# Patient Record
Sex: Male | Born: 1980 | Race: White | Hispanic: No | Marital: Single | Smoking: Current every day smoker
Health system: Southern US, Community
[De-identification: ages and names within clinical notes are randomized; demographics above are authoritative.]

## PROBLEM LIST (undated history)

## (undated) DIAGNOSIS — F329 Major depressive disorder, single episode, unspecified: Secondary | ICD-10-CM

## (undated) DIAGNOSIS — F32A Depression, unspecified: Secondary | ICD-10-CM

## (undated) DIAGNOSIS — M419 Scoliosis, unspecified: Secondary | ICD-10-CM

## (undated) HISTORY — PX: HERNIA REPAIR: SHX51

---

## 2007-11-20 ENCOUNTER — Emergency Department: Payer: Self-pay | Admitting: Emergency Medicine

## 2007-12-19 ENCOUNTER — Emergency Department: Payer: Self-pay | Admitting: Unknown Physician Specialty

## 2007-12-23 ENCOUNTER — Inpatient Hospital Stay: Payer: Self-pay | Admitting: Unknown Physician Specialty

## 2008-01-09 ENCOUNTER — Emergency Department: Payer: Self-pay | Admitting: Unknown Physician Specialty

## 2009-07-07 ENCOUNTER — Emergency Department: Payer: Self-pay | Admitting: Emergency Medicine

## 2010-09-28 ENCOUNTER — Emergency Department (HOSPITAL_COMMUNITY)
Admission: EM | Admit: 2010-09-28 | Discharge: 2010-09-28 | Payer: Self-pay | Source: Home / Self Care | Admitting: Emergency Medicine

## 2011-01-02 ENCOUNTER — Emergency Department (HOSPITAL_COMMUNITY)
Admission: EM | Admit: 2011-01-02 | Discharge: 2011-01-02 | Disposition: A | Discharge status: Home / Self Care | Payer: Self-pay | Attending: Emergency Medicine | Admitting: Emergency Medicine

## 2011-01-02 ENCOUNTER — Emergency Department (HOSPITAL_COMMUNITY): Payer: Self-pay

## 2011-01-02 DIAGNOSIS — M79609 Pain in unspecified limb: Secondary | ICD-10-CM | POA: Insufficient documentation

## 2011-01-02 DIAGNOSIS — Y929 Unspecified place or not applicable: Secondary | ICD-10-CM | POA: Insufficient documentation

## 2011-01-02 DIAGNOSIS — R51 Headache: Secondary | ICD-10-CM | POA: Insufficient documentation

## 2011-01-02 DIAGNOSIS — R042 Hemoptysis: Secondary | ICD-10-CM | POA: Insufficient documentation

## 2011-01-02 DIAGNOSIS — M2560 Stiffness of unspecified joint, not elsewhere classified: Secondary | ICD-10-CM | POA: Insufficient documentation

## 2011-01-02 DIAGNOSIS — S0990XA Unspecified injury of head, initial encounter: Secondary | ICD-10-CM | POA: Insufficient documentation

## 2011-01-02 DIAGNOSIS — S0003XA Contusion of scalp, initial encounter: Secondary | ICD-10-CM | POA: Insufficient documentation

## 2011-01-02 DIAGNOSIS — R071 Chest pain on breathing: Secondary | ICD-10-CM | POA: Insufficient documentation

## 2011-01-06 ENCOUNTER — Emergency Department (HOSPITAL_COMMUNITY)
Admission: EM | Admit: 2011-01-06 | Discharge: 2011-01-06 | Disposition: A | Discharge status: Home / Self Care | Payer: Self-pay | Attending: Emergency Medicine | Admitting: Emergency Medicine

## 2011-01-06 DIAGNOSIS — G44309 Post-traumatic headache, unspecified, not intractable: Secondary | ICD-10-CM | POA: Insufficient documentation

## 2011-01-06 DIAGNOSIS — R404 Transient alteration of awareness: Secondary | ICD-10-CM | POA: Insufficient documentation

## 2011-01-06 DIAGNOSIS — IMO0001 Reserved for inherently not codable concepts without codable children: Secondary | ICD-10-CM | POA: Insufficient documentation

## 2011-01-06 DIAGNOSIS — F0781 Postconcussional syndrome: Secondary | ICD-10-CM | POA: Insufficient documentation

## 2011-01-06 DIAGNOSIS — R112 Nausea with vomiting, unspecified: Secondary | ICD-10-CM | POA: Insufficient documentation

## 2011-01-06 DIAGNOSIS — Y92009 Unspecified place in unspecified non-institutional (private) residence as the place of occurrence of the external cause: Secondary | ICD-10-CM | POA: Insufficient documentation

## 2011-01-06 DIAGNOSIS — IMO0002 Reserved for concepts with insufficient information to code with codable children: Secondary | ICD-10-CM | POA: Insufficient documentation

## 2011-01-31 ENCOUNTER — Emergency Department (HOSPITAL_COMMUNITY)
Admission: EM | Admit: 2011-01-31 | Discharge: 2011-01-31 | Disposition: A | Discharge status: Home / Self Care | Payer: Self-pay | Attending: Emergency Medicine | Admitting: Emergency Medicine

## 2011-01-31 DIAGNOSIS — R45851 Suicidal ideations: Secondary | ICD-10-CM | POA: Insufficient documentation

## 2011-01-31 DIAGNOSIS — F1010 Alcohol abuse, uncomplicated: Secondary | ICD-10-CM | POA: Insufficient documentation

## 2011-01-31 DIAGNOSIS — F3289 Other specified depressive episodes: Secondary | ICD-10-CM | POA: Insufficient documentation

## 2011-01-31 DIAGNOSIS — F1410 Cocaine abuse, uncomplicated: Secondary | ICD-10-CM | POA: Insufficient documentation

## 2011-01-31 DIAGNOSIS — F17200 Nicotine dependence, unspecified, uncomplicated: Secondary | ICD-10-CM | POA: Insufficient documentation

## 2011-01-31 DIAGNOSIS — F329 Major depressive disorder, single episode, unspecified: Secondary | ICD-10-CM | POA: Insufficient documentation

## 2011-01-31 LAB — CBC
MCH: 36.4 pg — ABNORMAL HIGH (ref 26.0–34.0)
MCHC: 35.7 g/dL (ref 30.0–36.0)
RBC: 4.28 MIL/uL (ref 4.22–5.81)
WBC: 5.5 10*3/uL (ref 4.0–10.5)

## 2011-01-31 LAB — BASIC METABOLIC PANEL
Chloride: 108 mEq/L (ref 96–112)
GFR calc non Af Amer: >60 mL/min (ref >60)
Glucose, Bld: 103 mg/dL — ABNORMAL HIGH (ref 70–99)
Potassium: 3.7 mEq/L (ref 3.5–5.1)

## 2011-01-31 LAB — DIFFERENTIAL
Eosinophils Absolute: 0.2 10*3/uL (ref 0.0–0.7)
Eosinophils Relative: 3 % (ref 0–5)
Lymphocytes Relative: 37 % (ref 12–46)
Lymphs Abs: 2.1 10*3/uL (ref 0.7–4.0)
Monocytes Relative: 9 % (ref 3–12)

## 2011-01-31 LAB — RAPID URINE DRUG SCREEN, HOSP PERFORMED
Amphetamines: NOT DETECTED
Opiates: NOT DETECTED
Tetrahydrocannabinol: NOT DETECTED

## 2011-01-31 LAB — ETHANOL: Alcohol, Ethyl (B): 300 mg/dL — ABNORMAL HIGH (ref 0–10)

## 2011-08-19 ENCOUNTER — Emergency Department (HOSPITAL_COMMUNITY)
Admission: EM | Admit: 2011-08-19 | Discharge: 2011-08-19 | Disposition: A | Discharge status: Home / Self Care | Payer: Self-pay | Attending: Emergency Medicine | Admitting: Emergency Medicine

## 2011-08-19 ENCOUNTER — Emergency Department (HOSPITAL_COMMUNITY): Payer: Self-pay

## 2011-08-19 ENCOUNTER — Encounter: Payer: Self-pay | Admitting: Emergency Medicine

## 2011-08-19 DIAGNOSIS — H05239 Hemorrhage of unspecified orbit: Secondary | ICD-10-CM

## 2011-08-19 DIAGNOSIS — R51 Headache: Secondary | ICD-10-CM | POA: Insufficient documentation

## 2011-08-19 DIAGNOSIS — S022XXA Fracture of nasal bones, initial encounter for closed fracture: Secondary | ICD-10-CM | POA: Insufficient documentation

## 2011-08-19 DIAGNOSIS — M542 Cervicalgia: Secondary | ICD-10-CM | POA: Insufficient documentation

## 2011-08-19 DIAGNOSIS — S0510XA Contusion of eyeball and orbital tissues, unspecified eye, initial encounter: Secondary | ICD-10-CM | POA: Insufficient documentation

## 2011-08-19 DIAGNOSIS — T7411XA Adult physical abuse, confirmed, initial encounter: Secondary | ICD-10-CM | POA: Insufficient documentation

## 2011-08-19 HISTORY — DX: Scoliosis, unspecified: M41.9

## 2011-08-19 MED ORDER — HYDROCODONE-ACETAMINOPHEN 5-325 MG PO TABS: 1.0000 | ORAL_TABLET | Freq: Four times a day (QID) | ORAL | Status: AC | PRN
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
## 2013-05-28 ENCOUNTER — Emergency Department: Payer: Self-pay | Admitting: Emergency Medicine

## 2016-05-22 ENCOUNTER — Emergency Department
Admission: EM | Admit: 2016-05-22 | Discharge: 2016-05-23 | Disposition: A | Discharge status: Home / Self Care | Payer: Self-pay | Attending: Emergency Medicine | Admitting: Emergency Medicine

## 2016-05-22 ENCOUNTER — Encounter: Payer: Self-pay | Admitting: Emergency Medicine

## 2016-05-22 DIAGNOSIS — T1491XA Suicide attempt, initial encounter: Secondary | ICD-10-CM

## 2016-05-22 DIAGNOSIS — F1420 Cocaine dependence, uncomplicated: Secondary | ICD-10-CM

## 2016-05-22 DIAGNOSIS — F17210 Nicotine dependence, cigarettes, uncomplicated: Secondary | ICD-10-CM | POA: Insufficient documentation

## 2016-05-22 DIAGNOSIS — F332 Major depressive disorder, recurrent severe without psychotic features: Secondary | ICD-10-CM

## 2016-05-22 DIAGNOSIS — R4589 Other symptoms and signs involving emotional state: Secondary | ICD-10-CM

## 2016-05-22 DIAGNOSIS — F1020 Alcohol dependence, uncomplicated: Secondary | ICD-10-CM

## 2016-05-22 DIAGNOSIS — Y999 Unspecified external cause status: Secondary | ICD-10-CM | POA: Insufficient documentation

## 2016-05-22 DIAGNOSIS — IMO0002 Reserved for concepts with insufficient information to code with codable children: Secondary | ICD-10-CM

## 2016-05-22 DIAGNOSIS — Y939 Activity, unspecified: Secondary | ICD-10-CM | POA: Insufficient documentation

## 2016-05-22 DIAGNOSIS — T1491 Suicide attempt: Secondary | ICD-10-CM | POA: Insufficient documentation

## 2016-05-22 DIAGNOSIS — R4689 Other symptoms and signs involving appearance and behavior: Secondary | ICD-10-CM

## 2016-05-22 DIAGNOSIS — S51812A Laceration without foreign body of left forearm, initial encounter: Secondary | ICD-10-CM | POA: Insufficient documentation

## 2016-05-22 DIAGNOSIS — X788XXA Intentional self-harm by other sharp object, initial encounter: Secondary | ICD-10-CM | POA: Insufficient documentation

## 2016-05-22 DIAGNOSIS — F17200 Nicotine dependence, unspecified, uncomplicated: Secondary | ICD-10-CM

## 2016-05-22 DIAGNOSIS — Y929 Unspecified place or not applicable: Secondary | ICD-10-CM | POA: Insufficient documentation

## 2016-05-22 DIAGNOSIS — Z7289 Other problems related to lifestyle: Secondary | ICD-10-CM

## 2016-05-22 LAB — URINE DRUG SCREEN, QUALITATIVE (ARMC ONLY)
AMPHETAMINES, UR SCREEN: NOT DETECTED
BARBITURATES, UR SCREEN: NOT DETECTED
BENZODIAZEPINE, UR SCRN: NOT DETECTED
Cannabinoid 50 Ng, Ur ~~LOC~~: NOT DETECTED
Cocaine Metabolite,Ur ~~LOC~~: POSITIVE — AB
MDMA (Ecstasy)Ur Screen: NOT DETECTED
Methadone Scn, Ur: NOT DETECTED
OPIATE, UR SCREEN: NOT DETECTED
PHENCYCLIDINE (PCP) UR S: NOT DETECTED
Tricyclic, Ur Screen: NOT DETECTED

## 2016-05-22 LAB — CBC
HEMATOCRIT: 43.1 % (ref 40.0–52.0)
Hemoglobin: 15.2 g/dL (ref 13.0–18.0)
MCH: 36.1 pg — ABNORMAL HIGH (ref 26.0–34.0)
MCHC: 35.1 g/dL (ref 32.0–36.0)
MCV: 102.9 fL — AB (ref 80.0–100.0)
Platelets: 216 10*3/uL (ref 150–440)
RBC: 4.19 MIL/uL — ABNORMAL LOW (ref 4.40–5.90)
RDW: 14.4 % (ref 11.5–14.5)
WBC: 5.1 10*3/uL (ref 3.8–10.6)

## 2016-05-22 LAB — COMPREHENSIVE METABOLIC PANEL
ALBUMIN: 4.1 g/dL (ref 3.5–5.0)
ALK PHOS: 60 U/L (ref 38–126)
ALT: 32 U/L (ref 17–63)
AST: 51 U/L — AB (ref 15–41)
Anion gap: 9 (ref 5–15)
BILIRUBIN TOTAL: 0.7 mg/dL (ref 0.3–1.2)
BUN: 8 mg/dL (ref 6–20)
CALCIUM: 8.5 mg/dL — AB (ref 8.9–10.3)
CO2: 27 mmol/L (ref 22–32)
CREATININE: 0.81 mg/dL (ref 0.61–1.24)
Chloride: 105 mmol/L (ref 101–111)
GFR calc Af Amer: >60 mL/min (ref >60)
GFR calc non Af Amer: >60 mL/min (ref >60)
GLUCOSE: 98 mg/dL (ref 65–99)
Potassium: 4.1 mmol/L (ref 3.5–5.1)
Sodium: 141 mmol/L (ref 135–145)
TOTAL PROTEIN: 7 g/dL (ref 6.5–8.1)

## 2016-05-22 LAB — ACETAMINOPHEN LEVEL: Acetaminophen (Tylenol), Serum: <10 ug/mL — ABNORMAL LOW (ref 10–30)

## 2016-05-22 LAB — ETHANOL: Alcohol, Ethyl (B): 257 mg/dL — ABNORMAL HIGH (ref <5)

## 2016-05-22 LAB — SALICYLATE LEVEL: Salicylate Lvl: <4 mg/dL (ref 2.8–30.0)

## 2016-05-22 MED ORDER — LIDOCAINE HCL (PF) 1 % IJ SOLN
INTRAMUSCULAR | Status: AC
  Administered 2016-05-22: 10 mL via INTRADERMAL
  Filled 2016-05-22: qty 5

## 2016-05-22 MED ORDER — NICOTINE 21 MG/24HR TD PT24
21.0000 mg | MEDICATED_PATCH | Freq: Once | TRANSDERMAL | Status: DC
  Administered 2016-05-22: 21 mg via TRANSDERMAL
  Filled 2016-05-22: qty 1

## 2016-05-22 MED ORDER — BACITRACIN ZINC 500 UNIT/GM EX OINT
TOPICAL_OINTMENT | Freq: Two times a day (BID) | CUTANEOUS | Status: DC
  Administered 2016-05-22: via TOPICAL
  Administered 2016-05-23: 1 via TOPICAL
  Filled 2016-05-22 (×2): qty 0.9

## 2016-05-22 MED ORDER — LIDOCAINE HCL (PF) 1 % IJ SOLN
INTRAMUSCULAR | Status: AC
  Administered 2016-05-22: 23:00:00
  Filled 2016-05-22: qty 5

## 2016-05-22 MED ORDER — LIDOCAINE HCL (PF) 1 % IJ SOLN
10.0000 mL | Freq: Once | INTRAMUSCULAR | Status: AC
  Administered 2016-05-22: 10 mL via INTRADERMAL
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
## 2016-05-22 NOTE — ED Notes (Signed)
Pt changed into hospital gown, scrub bottoms,no-slip socks. Belongings in bag,labled and sent to locker in [place].
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
## 2016-05-22 NOTE — ED Notes (Addendum)
MD, Manson Passey at the bedside for sutures. 17 sutures applied to left forearm.  Bacitracin, gauze wrap and ice pack applied.

## 2016-05-23 ENCOUNTER — Inpatient Hospital Stay
Admit: 2016-05-23 | Discharge: 2016-05-28 | DRG: 885 | Disposition: A | Discharge status: Home / Self Care | Payer: No Typology Code available for payment source | Attending: Psychiatry | Admitting: Psychiatry

## 2016-05-23 ENCOUNTER — Encounter: Payer: Self-pay | Admitting: Psychiatry

## 2016-05-23 DIAGNOSIS — G8929 Other chronic pain: Secondary | ICD-10-CM | POA: Diagnosis present

## 2016-05-23 DIAGNOSIS — K219 Gastro-esophageal reflux disease without esophagitis: Secondary | ICD-10-CM | POA: Diagnosis present

## 2016-05-23 DIAGNOSIS — F1020 Alcohol dependence, uncomplicated: Secondary | ICD-10-CM | POA: Diagnosis present

## 2016-05-23 DIAGNOSIS — F332 Major depressive disorder, recurrent severe without psychotic features: Secondary | ICD-10-CM | POA: Diagnosis not present

## 2016-05-23 DIAGNOSIS — Z888 Allergy status to other drugs, medicaments and biological substances status: Secondary | ICD-10-CM | POA: Diagnosis not present

## 2016-05-23 DIAGNOSIS — Z590 Homelessness: Secondary | ICD-10-CM

## 2016-05-23 DIAGNOSIS — S51819A Laceration without foreign body of unspecified forearm, initial encounter: Secondary | ICD-10-CM | POA: Diagnosis present

## 2016-05-23 DIAGNOSIS — Y9289 Other specified places as the place of occurrence of the external cause: Secondary | ICD-10-CM

## 2016-05-23 DIAGNOSIS — F17210 Nicotine dependence, cigarettes, uncomplicated: Secondary | ICD-10-CM | POA: Diagnosis present

## 2016-05-23 DIAGNOSIS — G4700 Insomnia, unspecified: Secondary | ICD-10-CM | POA: Diagnosis present

## 2016-05-23 DIAGNOSIS — F1310 Sedative, hypnotic or anxiolytic abuse, uncomplicated: Secondary | ICD-10-CM

## 2016-05-23 DIAGNOSIS — R4589 Other symptoms and signs involving emotional state: Secondary | ICD-10-CM | POA: Diagnosis present

## 2016-05-23 DIAGNOSIS — R45851 Suicidal ideations: Secondary | ICD-10-CM | POA: Diagnosis present

## 2016-05-23 DIAGNOSIS — Z7289 Other problems related to lifestyle: Secondary | ICD-10-CM

## 2016-05-23 DIAGNOSIS — R4689 Other symptoms and signs involving appearance and behavior: Secondary | ICD-10-CM

## 2016-05-23 DIAGNOSIS — F1420 Cocaine dependence, uncomplicated: Secondary | ICD-10-CM | POA: Diagnosis present

## 2016-05-23 DIAGNOSIS — F17200 Nicotine dependence, unspecified, uncomplicated: Secondary | ICD-10-CM

## 2016-05-23 DIAGNOSIS — Z915 Personal history of self-harm: Secondary | ICD-10-CM | POA: Diagnosis not present

## 2016-05-23 DIAGNOSIS — IMO0002 Reserved for concepts with insufficient information to code with codable children: Secondary | ICD-10-CM | POA: Diagnosis present

## 2016-05-23 DIAGNOSIS — X788XXA Intentional self-harm by other sharp object, initial encounter: Secondary | ICD-10-CM | POA: Diagnosis present

## 2016-05-23 DIAGNOSIS — F10239 Alcohol dependence with withdrawal, unspecified: Secondary | ICD-10-CM | POA: Diagnosis present

## 2016-05-23 MED ORDER — ACETAMINOPHEN 500 MG PO TABS
1000.0000 mg | ORAL_TABLET | Freq: Four times a day (QID) | ORAL | Status: DC | PRN
  Administered 2016-05-23 (×2): 1000 mg via ORAL
  Filled 2016-05-23 (×2): qty 2

## 2016-05-23 MED ORDER — ALUM & MAG HYDROXIDE-SIMETH 200-200-20 MG/5ML PO SUSP: 30.0000 mL | ORAL | Status: DC | PRN

## 2016-05-23 MED ORDER — FLUOXETINE HCL 20 MG PO CAPS: 20.0000 mg | ORAL_CAPSULE | Freq: Every day | ORAL | Status: DC

## 2016-05-23 MED ORDER — BACITRACIN 500 UNIT/GM EX OINT
TOPICAL_OINTMENT | Freq: Two times a day (BID) | CUTANEOUS | Status: DC
  Filled 2016-05-23: qty 28.35

## 2016-05-23 MED ORDER — MAGNESIUM HYDROXIDE 400 MG/5ML PO SUSP
30.0000 mL | Freq: Every day | ORAL | Status: DC | PRN
Start: 2016-05-23 — End: 2016-05-23

## 2016-05-23 MED ORDER — LORAZEPAM 2 MG/ML IJ SOLN: 1.0000 mg | Freq: Four times a day (QID) | INTRAMUSCULAR | Status: DC | PRN

## 2016-05-23 MED ORDER — NICOTINE 21 MG/24HR TD PT24
21.0000 mg | MEDICATED_PATCH | Freq: Once | TRANSDERMAL | Status: AC
  Administered 2016-05-23: 21 mg via TRANSDERMAL
  Filled 2016-05-23: qty 1

## 2016-05-23 MED ORDER — ACETAMINOPHEN 325 MG PO TABS: 650.0000 mg | ORAL_TABLET | Freq: Four times a day (QID) | ORAL | Status: DC | PRN

## 2016-05-23 MED ORDER — VITAMIN B-1 100 MG PO TABS: 100.0000 mg | ORAL_TABLET | Freq: Every day | ORAL | Status: DC

## 2016-05-23 MED ORDER — LORAZEPAM 1 MG PO TABS: 1.0000 mg | ORAL_TABLET | Freq: Four times a day (QID) | ORAL | Status: DC | PRN

## 2016-05-23 MED ORDER — THIAMINE HCL 100 MG/ML IJ SOLN: 100.0000 mg | Freq: Every day | INTRAMUSCULAR | Status: DC

## 2016-05-23 MED ORDER — FOLIC ACID 1 MG PO TABS: 1.0000 mg | ORAL_TABLET | Freq: Every day | ORAL | Status: DC

## 2016-05-23 MED ORDER — NAPROXEN 500 MG PO TABS
500.0000 mg | ORAL_TABLET | Freq: Once | ORAL | Status: AC
Start: 2016-05-23 — End: 2016-05-23
  Administered 2016-05-23: 500 mg via ORAL
  Filled 2016-05-23: qty 1

## 2016-05-23 MED ORDER — ADULT MULTIVITAMIN W/MINERALS CH: 1.0000 | ORAL_TABLET | Freq: Every day | ORAL | Status: DC

## 2016-05-23 MED ORDER — CHLORDIAZEPOXIDE HCL 25 MG PO CAPS
25.0000 mg | ORAL_CAPSULE | Freq: Three times a day (TID) | ORAL | Status: DC
  Administered 2016-05-23 – 2016-05-25 (×6): 25 mg via ORAL
  Filled 2016-05-23 (×6): qty 1

## 2016-05-23 MED ORDER — MAGNESIUM HYDROXIDE 400 MG/5ML PO SUSP
30.0000 mL | Freq: Every day | ORAL | Status: DC | PRN
  Administered 2016-05-26 – 2016-05-27 (×2): 30 mL via ORAL
  Filled 2016-05-23 (×2): qty 30

## 2016-05-23 MED ORDER — FLUOXETINE HCL 10 MG PO CAPS
10.0000 mg | ORAL_CAPSULE | Freq: Every day | ORAL | Status: DC
  Administered 2016-05-24 – 2016-05-25 (×2): 10 mg via ORAL
  Filled 2016-05-23 (×2): qty 1

## 2016-05-23 MED ORDER — TRAZODONE HCL 100 MG PO TABS
100.0000 mg | ORAL_TABLET | Freq: Every day | ORAL | Status: DC
  Administered 2016-05-23: 100 mg via ORAL
  Filled 2016-05-23: qty 1

## 2016-05-23 MED ORDER — BACITRACIN 500 UNIT/GM EX OINT
1.0000 "application " | TOPICAL_OINTMENT | Freq: Two times a day (BID) | CUTANEOUS | Status: DC
  Administered 2016-05-23 – 2016-05-28 (×9): 1 via TOPICAL
  Filled 2016-05-23 (×20): qty 0.9

## 2016-05-23 MED ORDER — FOLIC ACID 1 MG PO TABS
1.0000 mg | ORAL_TABLET | Freq: Every day | ORAL | Status: DC
Start: 2016-05-23 — End: 2016-05-23

## 2016-05-23 MED ORDER — NAPROXEN 250 MG PO TABS
500.0000 mg | ORAL_TABLET | Freq: Three times a day (TID) | ORAL | Status: DC
  Administered 2016-05-23 – 2016-05-25 (×5): 500 mg via ORAL
  Filled 2016-05-23 (×5): qty 2

## 2016-05-23 MED ORDER — PANTOPRAZOLE SODIUM 40 MG PO TBEC
40.0000 mg | DELAYED_RELEASE_TABLET | Freq: Two times a day (BID) | ORAL | Status: DC
  Administered 2016-05-23 – 2016-05-28 (×10): 40 mg via ORAL
  Filled 2016-05-23 (×10): qty 1
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
## 2016-05-23 NOTE — ED Notes (Signed)
Laceration site to left forearm no bleeding or redness noted. Bacitracin and new non stick gauze applied

## 2016-05-23 NOTE — ED Notes (Signed)
Pt awake c/o pain to laceration. Requesting ibuprofen

## 2016-05-23 NOTE — [facility] Group Notes (Signed)
[facility] Group Notes:  (Nursing/MHT/Case Management/Adjunct)  Date:  05/23/2016  Time:  4:29 PM  Type of Therapy:  Psychoeducational Skills  Participation Level:  Did Not Attend  Participation Quality Mayra Neer 05/23/2016, 4:29 PM

## 2016-05-23 NOTE — Consult Note (Signed)
[facility] Consult   Reason for Consult:  Consult for 35 year old man brought into the hospital after self-inflicted laceration of his forearm Referring Physician:  Corky Downs Patient Identification: Christopher Murray MRN:  [id number] Principal Diagnosis: Severe recurrent major depression without psychotic features [facility]) Diagnosis:   Patient Active Problem List   Diagnosis Date Noted  . Alcohol use disorder, severe, dependence ([place]) [F10.20] 05/23/2016  . Cocaine use disorder, severe, dependence ([place]) [F14.20] 05/23/2016  . Tobacco use disorder [F17.200] 05/23/2016  . Self-inflicted injury [T55.7] 05/23/2016  . Severe recurrent major depression without psychotic features ([place]) [F33.2] 05/23/2016  . Substance induced mood disorder ([place]) [F19.94] 05/23/2016  . Suicidal behavior [F48.9] 05/23/2016    Total Time spent with patient: 1 hour  Subjective:   Christopher Murray is a 35 y.o. male patient admitted with "I cut myself".  HPI:  Patient interviewed. Chart reviewed. Labs and vitals reviewed. Case discussed with emergency room physician and TTS. 35 year old man says that yesterday evening he was on the telephone with his boss and got into an argument. He got so angry that he cut himself up on his left forearm. Apparently it was bad enough that he had to have surgical intervention onto it. Patient says that his mood has been depressed for years but has been getting worse recently. His sleep is typically poor at night. His appetite is typically poor. He has been drinking heavily and probably more than usual recently. He estimates about a 12 pack a day. Also occasionally using cocaine. Not currently getting any kind of mental health or psychiatric treatment. Feels like his work is stressful. Patient has chronic low mood and depression as well as anxiety and what sound like they might be panic attacks.  Medical history: Cut himself up pretty badly on the left forearm although after  being sutured up it is wrapped and looks like he doesn't need anything else immediately done to it. He doesn't know of any other ongoing medical problems.  Substance abuse history: Long-standing history of alcohol abuse. Has had periods of sobriety in the past but recently has been drinking about 12 beers by his estimates a day and it has been going up. No history of alcohol withdrawal seizures. Intermittent use of cocaine.  Social history: Currently had been living with his father. Works as an Clinical biochemist. Sounds like his life is been a little chaotic recently.  Past Psychiatric History: Long-standing problems with mood and mood instability. He's been hospitalized in the past. Had a suicide attempt around 2006 with a hospitalization. Hasn't been taking any medicine and about 10 years. He had a problem with allergy to Wellbutrin but no other medicines. Doesn't remember what other antidepressants he took. Does have a past history of suicide attempts. Long history of alcohol abuse.  Risk to Self: Suicidal Ideation: No Suicidal Intent: No Is patient at risk for suicide?: No Suicidal Plan?: No Access to Means: Yes (Pt cut his forearm with a blade) Specify Access to Suicidal Means: Pt cut his forearm with a blade What has been your use of drugs/alcohol within the last 12 months?: Alcohol, cocaine, benzodiazepines How many times?: 0 Other Self Harm Risks: Cutting Triggers for Past Attempts: None known Intentional Self Injurious Behavior: Cutting Comment - Self Injurious Behavior: Cutting to forearm ... 1.5 inches deep Risk to Others: Homicidal Ideation: No Thoughts of Harm to Others: No Current Homicidal Intent: No Current Homicidal Plan: No Access to Homicidal Means: No Identified Victim: N/A History of harm to  others?: No Assessment of Violence: None Noted Violent Behavior Description: N/A Does patient have access to weapons?: No Criminal Charges Pending?: No Does patient have a court  date: No Prior Inpatient Therapy: Prior Inpatient Therapy: Yes Prior Therapy Dates: 2013 Prior Therapy Facilty/Provider(s): [facility] Reason for Treatment: Depression Prior Outpatient Therapy: Prior Outpatient Therapy: No Prior Therapy Dates: N/A Prior Therapy Facilty/Provider(s): N/A Reason for Treatment: N/A Does patient have an ACCT team?: No Does patient have Intensive In-House Services?  : No Does patient have [facility] services? : No Does patient have P4CC services?: No  Past Medical History:  Past Medical History:  Diagnosis Date  . Scoliosis    History reviewed. No pertinent surgical history. Family History: History reviewed. No pertinent family history. Family Psychiatric  History: Positive for depression. Reports a history of suicide attempts in his family and multiple male members of his family with depression Social History:  History  Alcohol Use  . 3.6 oz/week  . 6 Cans of beer per week     History  Drug Use  . Frequency: 1.0 time per week  . Types: Cocaine, Marijuana    Social History   Social History  . Marital status: Single    Spouse name: N/A  . Number of children: N/A  . Years of education: N/A   Social History Main Topics  . Smoking status: Current Every Day Smoker    Packs/day: 1.00    Types: Cigarettes  . Smokeless tobacco: Never Used  . Alcohol use 3.6 oz/week    6 Cans of beer per week  . Drug use:     Frequency: 1.0 time per week    Types: Cocaine, Marijuana  . Sexual activity: No   Other Topics Concern  . None   Social History Narrative  . None   Additional Social History:    Allergies:   Allergies  Allergen Reactions  . Wellbutrin [Bupropion Hcl] Hives    Labs:  Results for orders placed or performed during the hospital encounter of 05/22/16 (from the past 48 hour(s))  Comprehensive metabolic panel     Status: Abnormal   Collection Time: 05/22/16 10:37 PM  Result Value Ref Range   Sodium 141 135 - 145 mmol/L   Potassium 4.1  3.5 - 5.1 mmol/L   Chloride 105 101 - 111 mmol/L   CO2 27 22 - 32 mmol/L   Glucose, Bld 98 65 - 99 mg/dL   BUN 8 6 - 20 mg/dL   Creatinine, Ser 0.81 0.61 - 1.24 mg/dL   Calcium 8.5 (L) 8.9 - 10.3 mg/dL   Total Protein 7.0 6.5 - 8.1 g/dL   Albumin 4.1 3.5 - 5.0 g/dL   AST 51 (H) 15 - 41 U/L   ALT 32 17 - 63 U/L   Alkaline Phosphatase 60 38 - 126 U/L   Total Bilirubin 0.7 0.3 - 1.2 mg/dL   GFR calc non Af Amer >60 >60 mL/min   GFR calc Af Amer >60 >60 mL/min    Comment: (NOTE) The eGFR has been calculated using the CKD EPI equation. This calculation has not been validated in all clinical situations. eGFR's persistently <60 mL/min signify possible Chronic Kidney Disease.    Anion gap 9 5 - 15  Ethanol     Status: Abnormal   Collection Time: 05/22/16 10:37 PM  Result Value Ref Range   Alcohol, Ethyl (B) 257 (H) <5 mg/dL    Comment:        LOWEST DETECTABLE LIMIT FOR  SERUM ALCOHOL IS 5 mg/dL FOR MEDICAL PURPOSES ONLY   Salicylate level     Status: None   Collection Time: 05/22/16 10:37 PM  Result Value Ref Range   Salicylate Lvl <7.3 2.8 - 30.0 mg/dL  Acetaminophen level     Status: Abnormal   Collection Time: 05/22/16 10:37 PM  Result Value Ref Range   Acetaminophen (Tylenol), Serum <10 (L) 10 - 30 ug/mL    Comment:        THERAPEUTIC CONCENTRATIONS VARY SIGNIFICANTLY. A RANGE OF 10-30 ug/mL MAY BE AN EFFECTIVE CONCENTRATION FOR MANY PATIENTS. HOWEVER, SOME ARE BEST TREATED AT CONCENTRATIONS OUTSIDE THIS RANGE. ACETAMINOPHEN CONCENTRATIONS >150 ug/mL AT 4 HOURS AFTER INGESTION AND >50 ug/mL AT 12 HOURS AFTER INGESTION ARE OFTEN ASSOCIATED WITH TOXIC REACTIONS.   cbc     Status: Abnormal   Collection Time: 05/22/16 10:37 PM  Result Value Ref Range   WBC 5.1 3.8 - 10.6 K/uL   RBC 4.19 (L) 4.40 - 5.90 MIL/uL   Hemoglobin 15.2 13.0 - 18.0 g/dL   HCT 43.1 40.0 - 52.0 %   MCV 102.9 (H) 80.0 - 100.0 fL   MCH 36.1 (H) 26.0 - 34.0 pg   MCHC 35.1 32.0 - 36.0 g/dL    RDW 14.4 11.5 - 14.5 %   Platelets 216 150 - 440 K/uL  Urine Drug Screen, Qualitative     Status: Abnormal   Collection Time: 05/22/16 10:37 PM  Result Value Ref Range   Tricyclic, Ur Screen NONE DETECTED NONE DETECTED   Amphetamines, Ur Screen NONE DETECTED NONE DETECTED   MDMA (Ecstasy)Ur Screen NONE DETECTED NONE DETECTED   Cocaine Metabolite,Ur [place] POSITIVE (A) NONE DETECTED   Opiate, Ur Screen NONE DETECTED NONE DETECTED   Phencyclidine (PCP) Ur S NONE DETECTED NONE DETECTED   Cannabinoid 50 Ng, Ur [place] NONE DETECTED NONE DETECTED   Barbiturates, Ur Screen NONE DETECTED NONE DETECTED   Benzodiazepine, Ur Scrn NONE DETECTED NONE DETECTED   Methadone Scn, Ur NONE DETECTED NONE DETECTED    Comment: (NOTE) 220  Tricyclics, urine               Cutoff 1000 ng/mL 200  Amphetamines, urine             Cutoff 1000 ng/mL 300  MDMA (Ecstasy), urine           Cutoff 500 ng/mL 400  Cocaine Metabolite, urine       Cutoff 300 ng/mL 500  Opiate, urine                   Cutoff 300 ng/mL 600  Phencyclidine (PCP), urine      Cutoff 25 ng/mL 700  Cannabinoid, urine              Cutoff 50 ng/mL 800  Barbiturates, urine             Cutoff 200 ng/mL 900  Benzodiazepine, urine           Cutoff 200 ng/mL 1000 Methadone, urine                Cutoff 300 ng/mL 1100 1200 The urine drug screen provides only a preliminary, unconfirmed 1300 analytical test result and should not be used for non-medical 1400 purposes. Clinical consideration and professional judgment should 1500 be applied to any positive drug screen result due to possible 1600 interfering substances. A more specific alternate chemical method 1700 must be used in order to obtain a confirmed analytical  result.  1800 Gas chromato graphy / mass spectrometry (GC/MS) is the preferred 1900 confirmatory method.     Current Facility-Administered Medications  Medication Dose Route Frequency Provider Last Rate Last Dose  . bacitracin ointment   Topical  BID Gregor Hams, MD   1 application at 17/51/02 [phone]  . FLUoxetine (PROZAC) capsule 20 mg  20 mg Oral Daily Gonzella Lex, MD      . folic acid (FOLVITE) tablet 1 mg  1 mg Oral Daily John T Clapacs, MD      . LORazepam (ATIVAN) tablet 1 mg  1 mg Oral Q6H PRN Gonzella Lex, MD       Or  . LORazepam (ATIVAN) injection 1 mg  1 mg Intravenous Q6H PRN Gonzella Lex, MD      . multivitamin with minerals tablet 1 tablet  1 tablet Oral Daily John T Clapacs, MD      . nicotine (NICODERM CQ - dosed in mg/24 hours) patch 21 mg  21 mg Transdermal Once Gregor Hams, MD   21 mg at 05/22/16 2343  . thiamine (VITAMIN B-1) tablet 100 mg  100 mg Oral Daily Gonzella Lex, MD       Or  . thiamine (B-1) injection 100 mg  100 mg Intravenous Daily Gonzella Lex, MD       No current outpatient prescriptions on file.    Musculoskeletal: Strength & Muscle Tone: within normal limits Gait & Station: normal Patient leans: N/A  Psychiatric Specialty Exam: Physical Exam  Nursing note and vitals reviewed. Constitutional: He appears well-developed and well-nourished.  HENT:  Head: Normocephalic and atraumatic.  Eyes: Conjunctivae are normal. Pupils are equal, round, and reactive to light.  Neck: Normal range of motion.  Cardiovascular: Normal heart sounds.   Respiratory: Effort normal.  GI: Soft.  Musculoskeletal: Normal range of motion.  Neurological: He is alert.  Skin: Skin is warm and dry.     Psychiatric: His speech is normal and behavior is normal. Thought content normal. His mood appears anxious. Cognition and memory are normal. He expresses impulsivity. He exhibits a depressed mood.    Review of Systems  Constitutional: Positive for weight loss.  HENT: Negative.   Eyes: Negative.   Respiratory: Negative.   Cardiovascular: Negative.   Gastrointestinal: Negative.   Musculoskeletal: Negative.   Skin: Negative.   Neurological: Negative.   Psychiatric/Behavioral: Positive for  depression, substance abuse and suicidal ideas. Negative for hallucinations and memory loss. The patient is nervous/anxious and has insomnia.     Blood pressure 109/65, pulse 95, temperature 97.8 F (36.6 C), temperature source Oral, resp. rate 16, height '5\' 9"'  (1.753 m), weight 67.6 kg (149 lb), SpO2 95 %.Body mass index is 22 kg/m.  General Appearance: Disheveled  Eye Contact:  Fair  Speech:  Clear and Coherent  Volume:  Decreased  Mood:  Dysphoric  Affect:  Congruent  Thought Process:  Goal Directed  Orientation:  Full (Time, Place, and Person)  Thought Content:  Logical  Suicidal Thoughts:  Yes.  without intent/plan  Homicidal Thoughts:  No  Memory:  Immediate;   Good Recent;   Fair Remote;   Fair  Judgement:  Fair  Insight:  Fair  Psychomotor Activity:  Decreased  Concentration:  Concentration: Fair  Recall:  [employer] of Knowledge:  Fair  Language:  Fair  Akathisia:  No  Handed:  Right  AIMS (if indicated):     Assets:  Desire for Improvement  Housing Physical Health Resilience  ADL's:  Intact  Cognition:  WNL  Sleep:        Treatment Plan Summary: Daily contact with patient to assess and evaluate symptoms and progress in treatment, Medication management and Plan 35 year old man with a history of depression and alcohol abuse. Came into the hospital having cut himself pretty badly on the arm. He was very intoxicated last night but this morning he is alert and oriented and able to interact appropriately. His pulse was up a little bit but he is not tremulous and doesn't look sicker delirious. Will be placed on alcohol withdrawal protocol just to make sure that we don't have to deal with alcohol withdrawal problems. Patient is calm and appropriate now but clearly has been more depressed with his mood being out of control recently. He is under IVC. He is agreeable to admission to the psychiatric hospital for safety. 15 minute precautions in place. I will go ahead and start  fluoxetine 20 mg a day as a reasonable first option treatment. Primary treatment team can carry on with further treatment plans on the unit. Case reviewed with the ER doctor and TTS.  Disposition: Recommend psychiatric Inpatient admission when medically cleared. Supportive therapy provided about ongoing stressors.  Alethia Berthold, MD 05/23/2016 11:23 AM

## 2016-05-23 NOTE — BH Assessment (Signed)
Patient is to be admitted to [facility] by Dr. Toni Amend.  Attending Physician will be Dr. Ardyth Harps.   Patient has been assigned to room 323, by [facility] Charge Nurse Gwen.   Intake Paper Work has been signed and placed on patient chart.  ER staff is aware of the admission Lewayne Bunting, ER Sect.; Dr. Cyril Loosen, ER MD; Audree Camel Patient's Nurse & Lowanda Foster, Patient Access).

## 2016-05-23 NOTE — Tx Team (Signed)
Initial Interdisciplinary Treatment Plan   PATIENT STRESSORS: Occupational concerns Substance abuse   PATIENT STRENGTHS: Average or above average intelligence Capable of independent living Communication skills Work skills   PROBLEM LIST: Problem List/Patient Goals Date to be addressed Date deferred Reason deferred Estimated date of resolution  Major depression 05/23/2016     Substance abuse 05/23/2016                                                DISCHARGE CRITERIA:  Ability to meet basic life and health needs Improved stabilization in mood, thinking, and/or behavior Verbal commitment to aftercare and medication compliance  PRELIMINARY DISCHARGE PLAN: Attend aftercare/continuing care group Return to previous living arrangement  PATIENT/FAMIILY INVOLVEMENT: This treatment plan has been presented to and reviewed with the patient, Christopher Murray, and/or family member,   The patient and family have been given the opportunity to ask questions and make suggestions.  Margo Common Honesti Seaberg 05/23/2016, 3:37 PM

## 2016-05-23 NOTE — ED Notes (Signed)
Nicholson Horne (father)  [phone] Pt's father states that he cannot come back to the house. States that he has become more aggressive and manipulative. States that he is an alcoholic and needs help. . Pt has just gotten off the phone with his Dad and told him he plans to rip his stitches out and finish the job. Tech informed and will continue to watch pt.

## 2016-05-23 NOTE — BHH Group Notes (Signed)
[facility] LCSW Group Therapy   05/23/2016  1 pm  Type of Therapy: Group Therapy   Participation Level: Did Not Attend. Patient invited to participate but declined.    Amiliah Campisi F. Ruthel Martine, MSW, LCSWA, LCAS     

## 2016-05-23 NOTE — [facility] Suicide Risk Assessment (Signed)
[facility] INPATIENT:  Family/Significant Other Suicide Prevention Education  Suicide Prevention Education:  Education Completed; mother, Lorayne Marek ph#: [phone] has been identified by the patient as the family member/significant other with whom the patient will be residing, and identified as the person(s) who will aid the patient in the event of a mental health crisis (suicidal ideations/suicide attempt).  With written consent from the patient, the family member/significant other has been provided the following suicide prevention education, prior to the and/or following the discharge of the patient. Pt mother is concerned that pt is using more substances than he is stating. She stated that he is not able to return home with his dad due to his suicide attempt and does not know what the family will be able to do in regards to living. CSW will speak with pt about residential substance abuse programs.  The suicide prevention education provided includes the following:  Suicide risk factors  Suicide prevention and interventions  National Suicide Hotline telephone number  [facility] assessment telephone number  [facility] Emergency Assistance 911  [facility] and/or Residential Mobile Crisis Unit telephone number  Request made of family/significant other to:  Remove weapons (e.g., guns, rifles, knives), all items previously/currently identified as safety concern.    Remove drugs/medications (over-the-counter, prescriptions, illicit drugs), all items previously/currently identified as a safety concern.  The family member/significant other verbalizes understanding of the suicide prevention education information provided.  The family member/significant other agrees to remove the items of safety concern listed above.  Lynden Oxford, MSW, LCSW-A 05/23/2016, 2:35 PM

## 2016-05-23 NOTE — Progress Notes (Signed)
Patient pleasant and cooperative during admission assessment. Patient denies SI/HI at this time. Patient denies AVH. Patient informed of fall risk status, fall risk assessed "low" at this time. Patient oriented to unit/staff/room. Patient denies any questions/concerns at this time. Patient safe on unit with Q15 minute checks for safety. Skin assessment & body search done.No contraband found. 

## 2016-05-23 NOTE — [facility] Suicide Risk Assessment (Signed)
[facility] Admission Suicide Risk Assessment   Nursing information obtained from:  Patient Demographic factors:  Male Current Mental Status:  NA Loss Factors:  Financial problems / change in socioeconomic status Historical Factors:  NA Risk Reduction Factors:  Living with another person, especially a relative  Total Time spent with patient: 1 hour Principal Problem: Severe recurrent major depression without psychotic features (HCC) Diagnosis:   Patient Active Problem List   Diagnosis Date Noted  . Alcohol use disorder, severe, dependence (HCC) [F10.20] 05/23/2016  . Cocaine use disorder, severe, dependence (HCC) [F14.20] 05/23/2016  . Tobacco use disorder [F17.200] 05/23/2016  . Self-inflicted injury [F48.9] 05/23/2016  . Severe recurrent major depression without psychotic features (HCC) [F33.2] 05/23/2016  . Suicidal behavior [F48.9] 05/23/2016  . 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  World Science writer [facility]). Score between 0-7:  no or low risk or alcohol related problems. Score between 8-15:  moderate risk of alcohol related problems. Score between 16-19:  high risk of alcohol related problems. Score 20 or above:  warrants further diagnostic evaluation for alcohol dependence and treatment.   CLINICAL FACTORS:   Severe Anxiety and/or Agitation Depression:   Comorbid alcohol abuse/dependence Impulsivity Insomnia Severe Alcohol/Substance Abuse/Dependencies Previous Psychiatric Diagnoses and Treatments   Psychiatric Specialty Exam: Physical Exam  ROS  Blood pressure 140/82, pulse (!) 110, temperature 97.4 F (36.3 C), temperature source Oral, resp. rate 18, height 5\' 9"  (1.753 m), weight 60.3 kg (133 lb), SpO2 98 %.Body mass  index is 19.64 kg/m.                                                    Sleep:         COGNITIVE FEATURES THAT CONTRIBUTE TO RISK:  None    SUICIDE RISK:   Moderate:  Frequent suicidal ideation with limited intensity, and duration, some specificity in terms of plans, no associated intent, good self-control, limited dysphoria/symptomatology, some risk factors present, and identifiable protective factors, including available and accessible social support.   PLAN OF CARE: admit to [facility]  I certify that inpatient services furnished can reasonably be expected to improve the patient's condition.  Jimmy Footman, MD 05/23/2016, 3:15 PM

## 2016-05-23 NOTE — ED Notes (Addendum)
Dr. Toni Amend (Psychiatry) at beside with pt at this time.

## 2016-05-23 NOTE — H&P (Signed)
Psychiatric Admission Assessment Adult  Patient Identification: Christopher Murray MRN:  [id number] Date of Evaluation:  05/23/2016 Chief Complaint:  Depression Principal Diagnosis: Severe recurrent major depression without psychotic features [facility]) Diagnosis:   Patient Active Problem List   Diagnosis Date Noted  . Alcohol use disorder, severe, dependence ([place]) [F10.20] 05/23/2016  . Cocaine use disorder, severe, dependence ([place]) [F14.20] 05/23/2016  . Tobacco use disorder [F17.200] 05/23/2016  . Self-inflicted injury [B01.7] 05/23/2016  . Severe recurrent major depression without psychotic features ([place]) [F33.2] 05/23/2016  . Suicidal behavior [F48.9] 05/23/2016  . 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 male. Pt presented to ED on 8/1 with a self-inflicted laceration on his arm. "Pt arrived to ED by EMS from fathers home after pt took a box cutter, with the intentions of harming self, and made a 4 inch vertical cut to the forearm, 1 1/2 inch deep. EMS reports pt cut forearm, ran into woods, police brought pt back to EMS. "  Patient requires sutures. His alcohol level at arrival was 257 and he was positive for cocaine.  Patient tells me he has been "flipping out" for about a month. He reports that he always had had issues with mental illness by his symptoms have been getting worse recently. Patient complains of having depressed mood and anxiety. He says that frequently he feels panicky and has severe anxiety that keeps him from eating. He says he sleeps about 4-5 hours at night, has been complaining of poor energy poor concentration, says that sometimes he just cannot remember simple things. He denies having any suicidal ideation prior to yesterday. He denies having any homicidal ideation. As far as auditory and visual hallucinations the patient stated that sometimes he sees things out of the corner of his  eye..  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 He has worked for the same employer for years. He says that is not refer them to get into verbal active medications. Yesterday he had a verbal altercation with his boss over the phone, he said that they were in disagreement about some work that needed to be done at a house. After the argument the patient grabbed a box cutter and cut his arm with the intention of ending his life.  His father found him and called EMS. The patient ran away into the woods as he did not want to be brought back to the hospital. Today he feels regretful about what he did. He wants to work on his depression and recognizes that he needs help.  As far as substance abuse the patient tells me that he drinks about a 12 pack of beers daily and has done so for the last 5 years. The patient has never S prevented withdrawal symptoms. He does report having eye openers on the weekends but denies blackouts. He also uses cocaine once in a while he said that he used cocaine a week ago and he used cocaine prior to that back in December. He has been using alprazolam or clonazepam twice per week which he gets from his neighbors. He feels that the alprazolam helps with his anxiety and issues with insomnia.  He recognizes his substance abuse is in remission and would like help to address it. Patient has had 3 DWIs in the past. He does not have a driver's license at this time. He has been in inpatient treatment for substance  abuse once 10 years ago.  Patient also smokes about one pack of cigarettes per day   Associated Signs/Symptoms: Depression Symptoms:  depressed mood, insomnia, fatigue, suicidal attempt, anxiety, loss of energy/fatigue, decreased appetite, (Hypo) Manic Symptoms:  Impulsivity, Irritable Mood, Anxiety Symptoms:  Panic Symptoms, Psychotic Symptoms:  denies PTSD Symptoms: NA Total Time spent with patient: 1 hour  Past Psychiatric History:  Patient was hospitalized in our unit several years ago. Patient states that admission was also for self injury. He had cut his arm however he said he was not as severe as it was now. The patient was here for about 3 days and he was then discharged.  [place] and was sober for about 2 years after discharge.  Is the patient at risk to self? Yes.    Has the patient been a risk to self in the past 6 months? No.  Has the patient been a risk to self within the distant past? Yes.    Is the patient a risk to others? No.  Has the patient been a risk to others in the past 6 months? No.  Has the patient been a risk to others within the distant past? No.    Past Medical History: Denies any history of head trauma or seizures  Past Medical History:  Diagnosis Date  . Scoliosis    History reviewed. No pertinent surgical history.  Family History: History reviewed. No pertinent family history.  Family Psychiatric  History: He has multiple relatives in his family that suffer from alcoholism including father grandfather great uncle and many uncles   Tobacco Screening: Have you used any form of tobacco in the last 30 days? (Cigarettes, Smokeless Tobacco, Cigars, and/or Pipes): Yes Tobacco use, Select all that apply: 5 or more cigarettes per day Are you interested in Tobacco Cessation Medications?: Yes, will notify MD for an order Counseled patient on smoking cessation including recognizing danger situations, developing coping skills and basic information about quitting provided: Yes  Social History: Patient is single, never married,. 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 He dropped out of high school but went back to school and completed a GED and then went back to college for 2 years. He denies having any legal charges currently but has had several  DWIs in the past  History  Alcohol Use  . 3.6 oz/week  . 6 Cans of beer per week     History  Drug Use  . Frequency: 1.0 time per week  . Types: Cocaine, Marijuana    Additional Social History: Marital status: Single Are you sexually active?: Yes What is your sexual orientation?: Straight  Has your sexual activity been affected by drugs, alcohol, medication, or emotional stress?: N/A Does patient have children?: No    History of alcohol / drug use?: Yes Name of Substance 1: cocaine 1 - Frequency: once 1 - Last Use / Amount: past week     Allergies:   Allergies  Allergen Reactions  . Wellbutrin [Bupropion Hcl] Hives  . Banana     Food allergy   Lab Results:  Results for orders placed or performed during the hospital encounter of 05/22/16 (from the past 48 hour(s))  Comprehensive metabolic panel     Status: Abnormal   Collection Time: 05/22/16 10:37 PM  Result Value Ref Range  Sodium 141 135 - 145 mmol/L   Potassium 4.1 3.5 - 5.1 mmol/L   Chloride 105 101 - 111 mmol/L   CO2 27 22 - 32 mmol/L   Glucose, Bld 98 65 - 99 mg/dL   BUN 8 6 - 20 mg/dL   Creatinine, Ser 0.81 0.61 - 1.24 mg/dL   Calcium 8.5 (L) 8.9 - 10.3 mg/dL   Total Protein 7.0 6.5 - 8.1 g/dL   Albumin 4.1 3.5 - 5.0 g/dL   AST 51 (H) 15 - 41 U/L   ALT 32 17 - 63 U/L   Alkaline Phosphatase 60 38 - 126 U/L   Total Bilirubin 0.7 0.3 - 1.2 mg/dL   GFR calc non Af Amer >60 >60 mL/min   GFR calc Af Amer >60 >60 mL/min    Comment: (NOTE) The eGFR has been calculated using the CKD EPI equation. This calculation has not been validated in all clinical situations. eGFR's persistently <60 mL/min signify possible Chronic Kidney Disease.    Anion gap 9 5 - 15  Ethanol     Status: Abnormal   Collection Time: 05/22/16 10:37 PM  Result Value Ref Range   Alcohol, Ethyl (B) 257 (H) <5 mg/dL    Comment:        LOWEST DETECTABLE LIMIT FOR SERUM ALCOHOL IS 5 mg/dL FOR MEDICAL PURPOSES ONLY   Salicylate level      Status: None   Collection Time: 05/22/16 10:37 PM  Result Value Ref Range   Salicylate Lvl <4.0 2.8 - 30.0 mg/dL  Acetaminophen level     Status: Abnormal   Collection Time: 05/22/16 10:37 PM  Result Value Ref Range   Acetaminophen (Tylenol), Serum <10 (L) 10 - 30 ug/mL    Comment:        THERAPEUTIC CONCENTRATIONS VARY SIGNIFICANTLY. A RANGE OF 10-30 ug/mL MAY BE AN EFFECTIVE CONCENTRATION FOR MANY PATIENTS. HOWEVER, SOME ARE BEST TREATED AT CONCENTRATIONS OUTSIDE THIS RANGE. ACETAMINOPHEN CONCENTRATIONS >150 ug/mL AT 4 HOURS AFTER INGESTION AND >50 ug/mL AT 12 HOURS AFTER INGESTION ARE OFTEN ASSOCIATED WITH TOXIC REACTIONS.   cbc     Status: Abnormal   Collection Time: 05/22/16 10:37 PM  Result Value Ref Range   WBC 5.1 3.8 - 10.6 K/uL   RBC 4.19 (L) 4.40 - 5.90 MIL/uL   Hemoglobin 15.2 13.0 - 18.0 g/dL   HCT 43.1 40.0 - 52.0 %   MCV 102.9 (H) 80.0 - 100.0 fL   MCH 36.1 (H) 26.0 - 34.0 pg   MCHC 35.1 32.0 - 36.0 g/dL   RDW 14.4 11.5 - 14.5 %   Platelets 216 150 - 440 K/uL  Urine Drug Screen, Qualitative     Status: Abnormal   Collection Time: 05/22/16 10:37 PM  Result Value Ref Range   Tricyclic, Ur Screen NONE DETECTED NONE DETECTED   Amphetamines, Ur Screen NONE DETECTED NONE DETECTED   MDMA (Ecstasy)Ur Screen NONE DETECTED NONE DETECTED   Cocaine Metabolite,Ur [place] POSITIVE (A) NONE DETECTED   Opiate, Ur Screen NONE DETECTED NONE DETECTED   Phencyclidine (PCP) Ur S NONE DETECTED NONE DETECTED   Cannabinoid 50 Ng, Ur [place] NONE DETECTED NONE DETECTED   Barbiturates, Ur Screen NONE DETECTED NONE DETECTED   Benzodiazepine, Ur Scrn NONE DETECTED NONE DETECTED   Methadone Scn, Ur NONE DETECTED NONE DETECTED    Comment: (NOTE) 100  Tricyclics, urine               Cutoff 1000 ng/mL 200  Amphetamines, urine               Cutoff 1000 ng/mL 300  MDMA (Ecstasy), urine           Cutoff 500 ng/mL 400  Cocaine Metabolite, urine       Cutoff 300 ng/mL 500  Opiate, urine                    Cutoff 300 ng/mL 600  Phencyclidine (PCP), urine      Cutoff 25 ng/mL 700  Cannabinoid, urine              Cutoff 50 ng/mL 800  Barbiturates, urine             Cutoff 200 ng/mL 900  Benzodiazepine, urine           Cutoff 200 ng/mL 1000 Methadone, urine                Cutoff 300 ng/mL 1100 1200 The urine drug screen provides only a preliminary, unconfirmed 1300 analytical test result and should not be used for non-medical 1400 purposes. Clinical consideration and professional judgment should 1500 be applied to any positive drug screen result due to possible 1600 interfering substances. A more specific alternate chemical method 1700 must be used in order to obtain a confirmed analytical result.  1800 Gas chromato graphy / mass spectrometry (GC/MS) is the preferred 1900 confirmatory method.     Blood Alcohol level:  Lab Results  Component Value Date   ETH 257 (H) 05/22/2016   ETH (H) 01/31/2011    300        LOWEST DETECTABLE LIMIT FOR SERUM ALCOHOL IS 5 mg/dL FOR MEDICAL PURPOSES ONLY    Metabolic Disorder Labs:  No results found for: HGBA1C, MPG No results found for: PROLACTIN No results found for: CHOL, TRIG, HDL, CHOLHDL, VLDL, LDLCALC  Current Medications: Current Facility-Administered Medications  Medication Dose Route Frequency Provider Last Rate Last Dose  . acetaminophen (TYLENOL) tablet 1,000 mg  1,000 mg Oral Q6H PRN  Hernandez-Gonzalez, MD      . alum & mag hydroxide-simeth (MAALOX/MYLANTA) 200-200-20 MG/5ML suspension 30 mL  30 mL Oral Q4H PRN John T Clapacs, MD      . bacitracin ointment 1 application  1 application Topical BID  Hernandez-Gonzalez, MD      . chlordiazePOXIDE (LIBRIUM) capsule 25 mg  25 mg Oral TID  Hernandez-Gonzalez, MD      . [START ON 05/24/2016] FLUoxetine (PROZAC) capsule 10 mg  10 mg Oral Daily  Hernandez-Gonzalez, MD      . magnesium hydroxide (MILK OF MAGNESIA) suspension 30 mL  30 mL Oral Daily PRN John T  Clapacs, MD      . naproxen (NAPROSYN) tablet 500 mg  500 mg Oral TID WC  Hernandez-Gonzalez, MD      . nicotine (NICODERM CQ - dosed in mg/24 hours) patch 21 mg  21 mg Transdermal Once  Hernandez-Gonzalez, MD      . pantoprazole (PROTONIX) EC tablet 40 mg  40 mg Oral BID AC  Hernandez-Gonzalez, MD      . traZODone (DESYREL) tablet 100 mg  100 mg Oral QHS  Hernandez-Gonzalez, MD       PTA Medications: No prescriptions prior to admission.    Musculoskeletal: Strength & Muscle Tone: within normal limits Gait & Station: normal Patient leans: N/A  Psychiatric Specialty Exam: Physical Exam  Constitutional: He appears well-developed and well-nourished.  HENT:  Head: Normocephalic and atraumatic.  Eyes: Conjunctivae and EOM are normal.  Neck: Normal range of motion.  Respiratory: Effort   normal.  Musculoskeletal: Normal range of motion.  Neurological: He is alert.    Review of Systems  Constitutional: Negative.   HENT: Negative.   Eyes: Negative.   Respiratory: Negative.   Cardiovascular: Negative.   Musculoskeletal: Positive for back pain.       Pain on arm with laceration  Skin: Negative.   Neurological: Negative.   Endo/Heme/Allergies: Negative.   Psychiatric/Behavioral: Positive for depression and substance abuse. Negative for hallucinations and suicidal ideas. The patient is nervous/anxious and has insomnia.     Blood pressure 140/82, pulse (!) 110, temperature 97.4 F (36.3 C), temperature source Oral, height 5' 9" (1.753 m), weight 60.3 kg (133 lb), SpO2 98 %.Body mass index is 19.64 kg/m.  General Appearance: Fairly Groomed  Eye Contact:  Good  Speech:  Clear and Coherent  Volume:  Normal  Mood:  Dysphoric  Affect:  Congruent  Thought Process:  Linear and Descriptions of Associations: Intact  Orientation:  Full (Time, Place, and Person)  Thought Content:  Hallucinations: None  Suicidal Thoughts:  No  Homicidal Thoughts:  No  Memory:   Immediate;   Good Recent;   Good Remote;   Good  Judgement:  Fair  Insight:  Fair  Psychomotor Activity:  Normal  Concentration:  Concentration: Fair and Attention Span: Fair  Recall:  Good  Fund of Knowledge:  Good  Language:  Good  Akathisia:  No  Handed:    AIMS (if indicated):     Assets:  Communication Skills Physical Health  ADL's:  Intact  Cognition:  WNL  Sleep:          Treatment Plan Summary:  MDD: Patient will be started on fluoxetine 10 mg by mouth daily  For insomnia and he will be started on trazodone 100 mg by mouth daily at bedtime  For alcohol withdrawal and he will be is started on Librium 25 mg by mouth 3 times a day.CIWA will be check 3 times a day along with vital signs  Alcohol, cocaine, benzodiazepine use disorder: Patient will be referred to intensive outpatient substance abuse upon discharge  Tobacco use disorder I will order nicotine patch 21 mg a day  Precautions every 15 minute checks  Hospitalization status continue involuntary commitment  Diet regular  Disposition once a stable he will be discharged back to his father's house  Follow-up the patient will be scheduled to follow-up with [facility] in [facility].    I certify that inpatient services furnished can reasonably be expected to improve the patient's condition.    Hildred Priest, MD 8/2/20172:56 PM

## 2016-05-23 NOTE — BH Assessment (Signed)
Assessment Note  Christopher Murray is an 35 y.o. male. Pt presents to ED stating "I got fired and I lost it". He states he has worked for an [employer] for 5 years and was fired today. When asked the reason he was fired, pt states "I don't know he didn't tell me". Christopher Murray states he currently lives with his father in [place], [place] - where he moved in "a few months ago". Prior to living with his father, pt was traveling to different states for his job. Pt reports that he has not slept in 4 days nor has he eaten since "last Friday". Pt also reports "I throw up constantly" ... Pt states he is unsure of why he vomits daily. When assessed for safety, pt denied SI/HI and stated "I just flipped out" (referring to his cuts on his left forearm). Pt states he feels he needs to be kept in the hospital for further evaluation. Pt reports his symptoms of depression to have lasted "for years ... My boss noticed it two weeks ago". He states he was also throwing up while at work. Pt reports recent use of alcohol, cocaine, and xanax/klonopin that he gets from a neighbor. He reports he's been "self-medicating" by consuming alcohol daily.   Pt was very pleasant during TTS assessment; however, pt used some profanity due to being irritable. Pt appeared to be tearful while TTS gathered information. Pt states his mother encouraged him to be honest when he presented to the hospital this evening. Pt reports his mother (lives in [place]) is a good support for him. When asked about his father, he reported "he's a drunk he doesn't see anything that's going on". Pt reports he does not have children. Pt denied past/current outpatient treatment; however, pt reports a previous hospitalization "10 years ago" at [facility]. Pt states he was admitted for "about a week". Pt was a good historian with logical and coherent thought process. Ct was oriented x4 and clear in speech. Pt denied AV/H and delusions.   Diagnosis:  Major Depressive  Disorder, Recurrent, Severe Alcohol Use Disorder, severe Cocaine Use Disorder, severe Sedative, Hypnotic and Anxiolytic Use Disorder, severe  Past Medical History:  Past Medical History:  Diagnosis Date  . Scoliosis     History reviewed. No pertinent surgical history.  Family History: History reviewed. No pertinent family history.  Social History:  reports that he has been smoking Cigarettes.  He has been smoking about 1.00 pack per day. He has never used smokeless tobacco. He reports that he drinks about 3.6 oz of alcohol per week . He reports that he uses drugs, including Cocaine and Marijuana, about 1 time per week.  Additional Social History:  Alcohol / Drug Use Pain Medications: None Reported Prescriptions: None Reported Over the Counter: None Reported History of alcohol / drug use?: Yes Substance #1 Name of Substance 1: Alcohol 1 - Age of First Use: UKN 1 - Amount (size/oz): "6-Pack of Beer" 1 - Frequency: "Daily" 1 - Duration: UKN 1 - Last Use / Amount: "today" (05/22/16) Substance #2 Name of Substance 2: Cocaine 2 - Age of First Use: UKN 2 - Amount (size/oz): "about a gram" - snort 2 - Frequency: "weekends, but not every weekend" 2 - Duration: UKN 2 - Last Use / Amount: "2 days ago" Substance #3 Name of Substance 3: Benzodiazepines - "Xanax/Klonopin" 3 - Age of First Use: UKN 3 - Amount (size/oz): "half a xanax bar ... the blue one" 3 - Frequency: "when I  can get em" 3 - Duration: UKN 3 - Last Use / Amount: "yesterday"  CIWA: CIWA-Ar BP: (!) 135/91 Pulse Rate: (!) 106 COWS:    Allergies:  Allergies  Allergen Reactions  . Wellbutrin [Bupropion Hcl]     Home Medications:  (Not in a hospital admission)  OB/GYN Status:  No LMP for male patient.  General Assessment Data Location of Assessment: [facility] ED TTS Assessment: In system Is this a Tele or Face-to-Face Assessment?: Face-to-Face Is this an Initial Assessment or a Re-assessment for this encounter?:  Initial Assessment Marital status: Single Maiden name: N/A Is patient pregnant?: No Pregnancy Status: No Living Arrangements: Parent (P lives with his father) Can pt return to current living arrangement?: Yes Admission Status: Voluntary Is patient capable of signing voluntary admission?: Yes Referral Source: Self/Family/Friend Insurance type: None  Medical Screening Exam [facility] Walk-in ONLY) Medical Exam completed: Yes  Crisis Care Plan Living Arrangements: Parent (P lives with his father) Armed forces operational officer Guardian: Other: (Self) Name of Psychiatrist: None Name of Therapist: None  Education Status Is patient currently in school?: No Current Grade: N/A Highest grade of school patient has completed: UKN Name of school: N/A Contact person: N/A  Risk to self with the past 6 months Suicidal Ideation: No Has patient been a risk to self within the past 6 months prior to admission? : Yes Suicidal Intent: No Has patient had any suicidal intent within the past 6 months prior to admission? : Yes Is patient at risk for suicide?: No Suicidal Plan?: No Has patient had any suicidal plan within the past 6 months prior to admission? : No Access to Means: Yes (Pt cut his forearm with a blade) Specify Access to Suicidal Means: Pt cut his forearm with a blade What has been your use of drugs/alcohol within the last 12 months?: Alcohol, cocaine, benzodiazepines Previous Attempts/Gestures: No How many times?: 0 Other Self Harm Risks: Cutting Triggers for Past Attempts: None known Intentional Self Injurious Behavior: Cutting Comment - Self Injurious Behavior: Cutting to forearm ... 1.5 inches deep Family Suicide History: Unknown Recent stressful life event(s): Job Loss, Conflict (Comment), Loss (Comment), Financial Problems (Conflict with father who drinks alcohol) Persecutory voices/beliefs?: No Depression: Yes Depression Symptoms: Insomnia, Tearfulness, Isolating, Fatigue, Guilt, Loss of interest in  usual pleasures, Feeling worthless/self pity, Feeling angry/irritable Substance abuse history and/or treatment for substance abuse?: Yes Suicide prevention information given to non-admitted patients: Yes  Risk to Others within the past 6 months Homicidal Ideation: No Does patient have any lifetime risk of violence toward others beyond the six months prior to admission? : No Thoughts of Harm to Others: No Current Homicidal Intent: No Current Homicidal Plan: No Access to Homicidal Means: No Identified Victim: N/A History of harm to others?: No Assessment of Violence: None Noted Violent Behavior Description: N/A Does patient have access to weapons?: No Criminal Charges Pending?: No Does patient have a court date: No Is patient on probation?: No  Psychosis Hallucinations: None noted Delusions: None noted  Mental Status Report Appearance/Hygiene: In scrubs, In hospital gown Eye Contact: Good Motor Activity: Freedom of movement, Unremarkable Speech: Logical/coherent Level of Consciousness: Alert, Crying, Irritable Mood: Depressed, Irritable, Guilty, Empty, Helpless, Worthless, low self-esteem, Ashamed/humiliated Affect: Depressed Anxiety Level: Minimal Thought Processes: Coherent, Relevant Judgement: Unimpaired Orientation: Person, Place, Time, Situation, Appropriate for developmental age Obsessive Compulsive Thoughts/Behaviors: None  Cognitive Functioning Concentration: Normal Memory: Recent Intact, Remote Intact IQ: Average Insight: Fair Impulse Control: Poor Appetite: Poor Weight Loss:  (Pt reports he has not  eaten since "last Friday") Weight Gain: 0 Sleep: Decreased (Pt states he has not slept in "4 days") Total Hours of Sleep: 0 Vegetative Symptoms: None  ADLScreening [facility] Assessment Services) Patient's cognitive ability adequate to safely complete daily activities?: Yes Patient able to express need for assistance with ADLs?: Yes Independently performs ADLs?: Yes  (appropriate for developmental age)  Prior Inpatient Therapy Prior Inpatient Therapy: Yes Prior Therapy Dates: 2013 Prior Therapy Facilty/Provider(s): [facility] Reason for Treatment: Depression  Prior Outpatient Therapy Prior Outpatient Therapy: No Prior Therapy Dates: N/A Prior Therapy Facilty/Provider(s): N/A Reason for Treatment: N/A Does patient have an ACCT team?: No Does patient have Intensive In-House Services?  : No Does patient have [facility] services? : No Does patient have P4CC services?: No  ADL Screening (condition at time of admission) Patient's cognitive ability adequate to safely complete daily activities?: Yes Patient able to express need for assistance with ADLs?: Yes Independently performs ADLs?: Yes (appropriate for developmental age)       Abuse/Neglect Assessment (Assessment to be complete while patient is alone) Physical Abuse: Denies Verbal Abuse: Denies Sexual Abuse: Denies Exploitation of patient/patient's resources: Denies Self-Neglect: Denies Values / Beliefs Cultural Requests During Hospitalization: None Spiritual Requests During Hospitalization: None Consults Spiritual Care Consult Needed: No Social Work Consult Needed: No Merchant navy officer (For Healthcare) Does patient have an advance directive?: No Would patient like information on creating an advanced directive?: No - patient declined information    Additional Information 1:1 In Past 12 Months?: No CIRT Risk: No Elopement Risk: No Does patient have medical clearance?: Yes  Child/Adolescent Assessment Running Away Risk: Denies Bed-Wetting: Denies Destruction of Property: Denies Cruelty to Animals: Denies Stealing: Denies Rebellious/Defies Authority: Denies Satanic Involvement: Denies Archivist: Denies Problems at [employer]: Denies Gang Involvement: Denies  Disposition:  Disposition Initial Assessment Completed for this Encounter: Yes Disposition of Patient: Referred to (Psych  MD to see) Patient referred to: Other (Comment) (Psych MD to see)  On Site Evaluation by:   Reviewed with Physician:    Wilmon Arms 05/23/2016 12:36 AM

## 2016-05-23 NOTE — ED Notes (Addendum)
Lorayne Marek Mother [phone] cell Please call mom with updates.  Given contact information for new room assignment

## 2016-05-24 MED ORDER — NICOTINE 21 MG/24HR TD PT24
21.0000 mg | MEDICATED_PATCH | Freq: Every day | TRANSDERMAL | Status: DC
  Administered 2016-05-24 – 2016-05-27 (×4): 21 mg via TRANSDERMAL
  Filled 2016-05-24 (×4): qty 1

## 2016-05-24 MED ORDER — TRAZODONE HCL 50 MG PO TABS
150.0000 mg | ORAL_TABLET | Freq: Every day | ORAL | Status: DC
  Administered 2016-05-24 – 2016-05-27 (×4): 150 mg via ORAL
  Filled 2016-05-24 (×4): qty 1

## 2016-05-24 MED ORDER — HYDROXYZINE HCL 50 MG PO TABS
50.0000 mg | ORAL_TABLET | Freq: Three times a day (TID) | ORAL | Status: DC | PRN
  Administered 2016-05-24 – 2016-05-28 (×11): 50 mg via ORAL
  Filled 2016-05-24 (×13): qty 1

## 2016-05-24 NOTE — Progress Notes (Signed)
D: Patient is alert and oriented on the unit this shift. Patient not attended  in groups today. Patient denies suicidal ideation, homicidal ideation, auditory or visual hallucinations at the present time.  A: Scheduled medications are administered to patient as per MD orders. Emotional support and encouragement are provided. Patient is maintained on q.15 minute safety checks. Patient is informed to notify staff with questions or concerns. R: No adverse medication reactions are noted. Patient is cooperative with medication administration and treatment plan today. Patient is receptive, anxious and cooperative on the unit at this time. Patient isolative   on the unit this shift. Patient contracts for safety at this time. Patient remains safe at this time.

## 2016-05-24 NOTE — Progress Notes (Signed)
D: Pt denies SI/HI/AVH. Pt is pleasant and cooperative. Pt dressing changed- clean dry, intact.   A: Pt was offered support and encouragement. Pt was given scheduled medications. Pt was encourage to attend groups. Q 15 minute checks were done for safety.   R:Pt attends groups and interacts well with peers and staff. Pt is taking medication. Pt has no complaints at this time.Pt receptive to treatment and safety maintained on unit.

## 2016-05-24 NOTE — Progress Notes (Signed)
Recreation Therapy Notes  Date: 08.03.17 Time: 1:00 pm Location: Craft Room  Group Topic: Leisure Education  Goal Area(s) Addresses:  Patient will identify activities for each letter of the alphabet. Patient will verbalize ability to use leisure as a Associate Professor.  Behavioral Response: Attentive, Interactive  Intervention: Leisure Alphabet  Activity: Patients were given a Leisure Information systems manager and instructed to write healthy leisure activities for each letter of the alphabet.  Education: LRT educated patients on what they need to participate in leisure.  Education Outcome: Acknowledges education/In group clarification offered   Clinical Observations/Feedback: Patient completed activity by writing healthy leisure activities. Patient contributed to group discussion by stating some healthy leisure activities, what he needs to participate in leisure, what makes leisure a good coping skill, and how he can integrate leisure back into his schedule.  Jacquelynn Cree, LRT/CTRS 05/24/2016 1:50 PM

## 2016-05-24 NOTE — [facility] Group Notes (Signed)
[facility] LCSW Group Therapy   05/24/2016  1 pm  Type of Therapy: Group Therapy   Participation Level: Did Not Attend. Patient invited to participate but declined.    Delara Shepheard F. Deneene Tarver, MSW, LCSWA, LCAS     

## 2016-05-24 NOTE — Progress Notes (Signed)
[facility] MD Progress Note  05/24/2016 3:57 PM Christopher Murray  MRN:  [id number] Subjective:Christopher Husby Troxleris an 35 y.o.male. Pt presented to ED on 8/1 with a self-inflicted laceration on his arm. "Pt arrived to ED by EMS from fathers home after pt took a box cutter, with the intentions of harming self, and made a 4 inch vertical cut to the forearm, 1 1/2 inch deep. EMS reports pt cut forearm, ran into woods, police brought pt back to EMS. "  Patient requires sutures. His alcohol level at arrival was 257 and he was positive for cocaine.  Patient reports feeling very angry and anxious today. He says that he spoke with his father and his father has informed him he is not allowed to return to live there.  Patient is now homeless. Also patient fears that he might not have a job anymore. He says that he hasn't called his boss because he is afraid that his being fired.  He said that because of all of these problems he couldn't fall asleep last night, today he has been feeling nauseous, anxious and very sad. He was quite tearful during the assessment.  Patient continues to have issues with poor appetite, his mood continues to be depressed. He no longer is having suicidal ideation but is overwhelmed with the current issues of him losing his job and being homeless.  Patient has been cooperative, calm and pleasant with nurses and peers. He has been actively participating in groups.  In addition the patient tells me that in the past he was diagnosed with OCD. He tells me that he has issues with counting things, he has to do things or to ways otherwise he fears that something bad could happen, he feels that he is very superstitious, he however denies spending a significant amount of time performing compulsions. He says that he does not interfere with his functioning at work or in his day-to-day activities.  As there is not significant impairment of functioning he does not meet criteria for obsessive-compulsive  disorder.  Patient complains of having pain however he feels that the pain self inflicted laceration) is well controlled with Tylenol and naproxen. He complains of having some difficulties grabbing things he said yesterday he was dropping things from his hand as he could not completely close it.  He said that that is getting better today.  Per nursing: D: Patient is alert and oriented on the unit this shift. Patient not attended  in groups today. Patient denies suicidal ideation, homicidal ideation, auditory or visual hallucinations at the present time.  A: Scheduled medications are administered to patient as per MD orders. Emotional support and encouragement are provided. Patient is maintained on q.15 minute safety checks. Patient is informed to notify staff with questions or concerns. R: No adverse medication reactions are noted. Patient is cooperative with medication administration and treatment plan today. Patient is receptive, anxious and cooperative on the unit at this time. Patient isolative   on the unit this shift. Patient contracts for safety at this time. Patient remains safe at this time.  Principal Problem: Severe recurrent major depression without psychotic features ([place]) Diagnosis:   Patient Active Problem List   Diagnosis Date Noted  . Alcohol use disorder, severe, dependence ([place]) [F10.20] 05/23/2016  . Cocaine use disorder, severe, dependence ([place]) [F14.20] 05/23/2016  . Tobacco use disorder [F17.200] 05/23/2016  . Self-inflicted injury [V74.8] 05/23/2016  . Severe recurrent major depression without psychotic features ([place]) [F33.2] 05/23/2016  . Suicidal behavior [F48.9]  05/23/2016  . 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 Scoliosis    History reviewed. No pertinent surgical history. Family History:  History reviewed. No pertinent family history. Family Psychiatric  History:  Social History:  History  Alcohol Use  . 3.6 oz/week  . 6 Cans of beer per week     History  Drug Use  . Frequency: 1.0 time per week  . Types: Cocaine, Marijuana    Social History   Social History  . Marital status: Single    Spouse name: N/A  . Number of children: N/A  . Years of education: N/A   Social History Main Topics  . Smoking status: Current Every Day Smoker    Packs/day: 1.00    Types: Cigarettes  . Smokeless tobacco: Never Used  . Alcohol use 3.6 oz/week    6 Cans of beer per week  . Drug use:     Frequency: 1.0 time per week    Types: Cocaine, Marijuana  . Sexual activity: No   Other Topics Concern  . None   Social History Narrative  . None   Additional Social History:    History of alcohol / drug use?: Yes Name of Substance 1: cocaine 1 - Frequency: once 1 - Last Use / Amount: past week         Current Medications: Current Facility-Administered Medications  Medication Dose Route Frequency Provider Last Rate Last Dose  . acetaminophen (TYLENOL) tablet 1,000 mg  1,000 mg Oral Q6H PRN Hildred Priest, MD   1,000 mg at 05/23/16 2202  . alum & mag hydroxide-simeth (MAALOX/MYLANTA) 200-200-20 MG/5ML suspension 30 mL  30 mL Oral Q4H PRN Gonzella Lex, MD      . bacitracin ointment 1 application  1 application Topical BID Hildred Priest, MD   1 application at 63/78/58 2150  . chlordiazePOXIDE (LIBRIUM) capsule 25 mg  25 mg Oral TID Hildred Priest, MD   25 mg at 05/24/16 0844  . FLUoxetine (PROZAC) capsule 10 mg  10 mg Oral Daily Hildred Priest, MD   10 mg at 05/24/16 0845  . hydrOXYzine (ATARAX/VISTARIL) tablet 50 mg  50 mg Oral TID PRN Hildred Priest, MD      . magnesium hydroxide (MILK OF MAGNESIA) suspension 30 mL  30 mL Oral Daily PRN Gonzella Lex, MD      . naproxen (NAPROSYN) tablet 500 mg  500 mg Oral TID WC  Hildred Priest, MD   500 mg at 05/24/16 1201  . nicotine (NICODERM CQ - dosed in mg/24 hours) patch 21 mg  21 mg Transdermal Daily Hildred Priest, MD      . pantoprazole (PROTONIX) EC tablet 40 mg  40 mg Oral BID AC Hildred Priest, MD   40 mg at 05/24/16 0844  . traZODone (DESYREL) tablet 150 mg  150 mg Oral QHS Hildred Priest, MD        Lab Results:  Results for orders placed or performed during the hospital encounter of 05/22/16 (from the past 48 hour(s))  Comprehensive metabolic panel     Status: Abnormal   Collection Time: 05/22/16 10:37 PM  Result Value Ref Range   Sodium 141 135 - 145 mmol/L   Potassium 4.1 3.5 - 5.1 mmol/L   Chloride 105 101 - 111 mmol/L   CO2 27 22 - 32 mmol/L  Glucose, Bld 98 65 - 99 mg/dL   BUN 8 6 - 20 mg/dL   Creatinine, Ser 0.81 0.61 - 1.24 mg/dL   Calcium 8.5 (L) 8.9 - 10.3 mg/dL   Total Protein 7.0 6.5 - 8.1 g/dL   Albumin 4.1 3.5 - 5.0 g/dL   AST 51 (H) 15 - 41 U/L   ALT 32 17 - 63 U/L   Alkaline Phosphatase 60 38 - 126 U/L   Total Bilirubin 0.7 0.3 - 1.2 mg/dL   GFR calc non Af Amer >60 >60 mL/min   GFR calc Af Amer >60 >60 mL/min    Comment: (NOTE) The eGFR has been calculated using the CKD EPI equation. This calculation has not been validated in all clinical situations. eGFR's persistently <60 mL/min signify possible Chronic Kidney Disease.    Anion gap 9 5 - 15  Ethanol     Status: Abnormal   Collection Time: 05/22/16 10:37 PM  Result Value Ref Range   Alcohol, Ethyl (B) 257 (H) <5 mg/dL    Comment:        LOWEST DETECTABLE LIMIT FOR SERUM ALCOHOL IS 5 mg/dL FOR MEDICAL PURPOSES ONLY   Salicylate level     Status: None   Collection Time: 05/22/16 10:37 PM  Result Value Ref Range   Salicylate Lvl <6.2 2.8 - 30.0 mg/dL  Acetaminophen level     Status: Abnormal   Collection Time: 05/22/16 10:37 PM  Result Value Ref Range   Acetaminophen (Tylenol), Serum <10 (L) 10 - 30 ug/mL    Comment:         THERAPEUTIC CONCENTRATIONS VARY SIGNIFICANTLY. A RANGE OF 10-30 ug/mL MAY BE AN EFFECTIVE CONCENTRATION FOR MANY PATIENTS. HOWEVER, SOME ARE BEST TREATED AT CONCENTRATIONS OUTSIDE THIS RANGE. ACETAMINOPHEN CONCENTRATIONS >150 ug/mL AT 4 HOURS AFTER INGESTION AND >50 ug/mL AT 12 HOURS AFTER INGESTION ARE OFTEN ASSOCIATED WITH TOXIC REACTIONS.   cbc     Status: Abnormal   Collection Time: 05/22/16 10:37 PM  Result Value Ref Range   WBC 5.1 3.8 - 10.6 K/uL   RBC 4.19 (L) 4.40 - 5.90 MIL/uL   Hemoglobin 15.2 13.0 - 18.0 g/dL   HCT 43.1 40.0 - 52.0 %   MCV 102.9 (H) 80.0 - 100.0 fL   MCH 36.1 (H) 26.0 - 34.0 pg   MCHC 35.1 32.0 - 36.0 g/dL   RDW 14.4 11.5 - 14.5 %   Platelets 216 150 - 440 K/uL  Urine Drug Screen, Qualitative     Status: Abnormal   Collection Time: 05/22/16 10:37 PM  Result Value Ref Range   Tricyclic, Ur Screen NONE DETECTED NONE DETECTED   Amphetamines, Ur Screen NONE DETECTED NONE DETECTED   MDMA (Ecstasy)Ur Screen NONE DETECTED NONE DETECTED   Cocaine Metabolite,Ur [place] POSITIVE (A) NONE DETECTED   Opiate, Ur Screen NONE DETECTED NONE DETECTED   Phencyclidine (PCP) Ur S NONE DETECTED NONE DETECTED   Cannabinoid 50 Ng, Ur [place] NONE DETECTED NONE DETECTED   Barbiturates, Ur Screen NONE DETECTED NONE DETECTED   Benzodiazepine, Ur Scrn NONE DETECTED NONE DETECTED   Methadone Scn, Ur NONE DETECTED NONE DETECTED    Comment: (NOTE) 831  Tricyclics, urine               Cutoff 1000 ng/mL 200  Amphetamines, urine             Cutoff 1000 ng/mL 300  MDMA (Ecstasy), urine           Cutoff 500 ng/mL  400  Cocaine Metabolite, urine       Cutoff 300 ng/mL 500  Opiate, urine                   Cutoff 300 ng/mL 600  Phencyclidine (PCP), urine      Cutoff 25 ng/mL 700  Cannabinoid, urine              Cutoff 50 ng/mL 800  Barbiturates, urine             Cutoff 200 ng/mL 900  Benzodiazepine, urine           Cutoff 200 ng/mL 1000 Methadone, urine                Cutoff 300  ng/mL 1100 1200 The urine drug screen provides only a preliminary, unconfirmed 1300 analytical test result and should not be used for non-medical 1400 purposes. Clinical consideration and professional judgment should 1500 be applied to any positive drug screen result due to possible 1600 interfering substances. A more specific alternate chemical method 1700 must be used in order to obtain a confirmed analytical result.  1800 Gas chromato graphy / mass spectrometry (GC/MS) is the preferred 1900 confirmatory method.     Blood Alcohol level:  Lab Results  Component Value Date   ETH 257 (H) 05/22/2016   ETH (H) 01/31/2011    300        LOWEST DETECTABLE LIMIT FOR SERUM ALCOHOL IS 5 mg/dL FOR MEDICAL PURPOSES ONLY    Metabolic Disorder Labs: No results found for: HGBA1C, MPG No results found for: PROLACTIN No results found for: CHOL, TRIG, HDL, CHOLHDL, VLDL, LDLCALC  Physical Findings: AIMS: Facial and Oral Movements Muscles of Facial Expression: None, normal Lips and Perioral Area: None, normal Jaw: None, normal Tongue: None, normal,Extremity Movements Upper (arms, wrists, hands, fingers): None, normal Lower (legs, knees, ankles, toes): None, normal, Trunk Movements Neck, shoulders, hips: None, normal, Overall Severity Severity of abnormal movements (highest score from questions above): None, normal Incapacitation due to abnormal movements: None, normal Patient's awareness of abnormal movements (rate only patient's report): No Awareness, Dental Status Current problems with teeth and/or dentures?: No Does patient usually wear dentures?: No  CIWA:  CIWA-Ar Total: 3 COWS:     Musculoskeletal: Strength & Muscle Tone: within normal limits Gait & Station: normal Patient leans: N/A  Psychiatric Specialty Exam: Physical Exam  Constitutional: He is oriented to person, place, and time. He appears well-developed and well-nourished.  HENT:  Head: Normocephalic.  Eyes:  Conjunctivae and EOM are normal.  Neck: Normal range of motion.  Respiratory: Effort normal.  Musculoskeletal: Normal range of motion.  Neurological: He is alert and oriented to person, place, and time.    Review of Systems  Constitutional: Negative.   HENT: Negative.   Respiratory: Negative.   Cardiovascular: Negative.   Gastrointestinal: Negative.   Genitourinary: Negative.   Musculoskeletal: Positive for joint pain.  Skin: Negative.   Neurological: Negative.   Endo/Heme/Allergies: Negative.   Psychiatric/Behavioral: Positive for depression and substance abuse. The patient is nervous/anxious and has insomnia.     Blood pressure 124/70, pulse 82, temperature 97.5 F (36.4 C), resp. rate 18, height '5\' 9"'  (1.753 m), weight 60.3 kg (133 lb), SpO2 98 %.Body mass index is 19.64 kg/m.  General Appearance: Fairly Groomed  Eye Contact:  Good  Speech:  Clear and Coherent  Volume:  Normal  Mood:  Anxious and Dysphoric  Affect:  Appropriate and Congruent  Thought Process:  Linear and Descriptions of Associations: Intact  Orientation:  Full (Time, Place, and Person)  Thought Content:  Hallucinations: None  Suicidal Thoughts:  No  Homicidal Thoughts:  No  Memory:  Immediate;   Good Recent;   Good Remote;   Good  Judgement:  Fair  Insight:  Fair  Psychomotor Activity:  Decreased  Concentration:  Concentration: Good and Attention Span: Good  Recall:  Good  Fund of Knowledge:  Good  Language:  Good  Akathisia:  No  Handed:    AIMS (if indicated):     Assets:  Communication Skills Physical Health  ADL's:  Intact  Cognition:  WNL  Sleep:  Number of Hours: 6.45     Treatment Plan Summary:  MDD: Patient has been  started on fluoxetine 10 mg by mouth daily  For insomnia: I will increase trazodone to 150 mg by mouth daily at bedtime  For alcohol withdrawal and he will be is started on Librium 25 mg by mouth 3 times a day.CIWA  and VS are wnl.  Will d/c CIWA and VS will be  checked once q day.  Alcohol, cocaine, benzodiazepine use disorder: Patient will be referred to intensive outpatient substance abuse upon discharge  Tobacco use disorder : continue  nicotine patch 21 mg a day  Self-inflicted laceration: I will consult physical therapy as patient has reported having difficulty with closely his fist, numbness and what appears to be neuropathic pain.  Precautions every 15 minute checks  Hospitalization status continue involuntary commitment  Diet regular  Disposition once a stable he will be discharged back to his father's house  Follow-up the patient will be scheduled to follow-up with [facility] in [facility].      Hildred Priest, MD 05/24/2016, 3:57 PM

## 2016-05-24 NOTE — Progress Notes (Signed)
Denies SI/HI/AVH.  Pleasant and cooperative.  Verbalizes that he is starting to feel better.  Dressing clean dry and intact. Support and encouragement offered.  Safety maintainted.

## 2016-05-24 NOTE — Progress Notes (Signed)
Recreation Therapy Notes  INPATIENT RECREATION THERAPY ASSESSMENT  Patient Details Name: DEMANTE BREINING MRN: [id number] DOB: 02/19/1981 Today's Date: 05/24/2016  Patient Stressors: Family, Death, Work, Other (Comment) (Doesn't get along with father; family and friends have died recently; stressful with boss and coworkers; doesn't get to see family much because they live far away)  Coping Skills:   Isolate, Substance Abuse, Exercise, Art/Dance, Music, Talking, Sports, Other (Comment) Programmer, applications work)  Engineer, building services: Concentration, Decision-Making, Relationships, Stress Management, Substance Abuse  Leisure Interests (2+):  Individual - Other (Comment), Nature - Vegetable gardening (Play with animals)  Awareness of Community Resources:  Yes  Community Resources:  [employer], [employer]  Current Use: Yes  If no, Barriers?:    Patient Strengths:  Attitude, short hair  Patient Identified Areas of Improvement:  Anxiety, depression issues  Current Recreation Participation:  Picking snap beans  Patient Goal for Hospitalization:  To get better and work on himself  [place] of Residence:  [place] of Residence:     Current SI (including self-harm):  No  Current HI:  No  Consent to Intern Participation: N/A   Jacquelynn Cree, LRT/CTRS 05/24/2016, 3:08 PM

## 2016-05-24 NOTE — [facility] Group Notes (Signed)
Goals Group  Date/Time: 05/24/2016 9am  Type of Therapy and Topic: Group Therapy: Goals Group: SMART Goals   Pt was called, but did not attend   Kerolos Nehme F. Taeko Schaffer, LCSWA, LCAS  

## 2016-05-24 NOTE — Plan of Care (Signed)
Problem: Coping: Goal: Ability to cope will improve Outcome: Not Progressing Pt not able to utilize coping skills at this time [employer]

## 2016-05-24 NOTE — [facility] Group Notes (Signed)
[facility] Group Notes:  (Nursing/MHT/Case Management/Adjunct)  Date:  05/24/2016  Time:  3:00 AM  Type of Therapy:  Group Therapy  Participation Level:  Active  Participation Quality:  Appropriate  Affect:  Appropriate  Cognitive:  Appropriate  Insight:  Appropriate  Engagement in Group:  Engaged  Modes of Intervention:  n/a  Summary of Progress/Problems:  Christopher Murray 05/24/2016, 3:00 AM

## 2016-05-25 ENCOUNTER — Encounter: Payer: Self-pay | Admitting: Psychiatry

## 2016-05-25 MED ORDER — CHLORDIAZEPOXIDE HCL 10 MG PO CAPS
10.0000 mg | ORAL_CAPSULE | Freq: Three times a day (TID) | ORAL | Status: DC
  Administered 2016-05-25 – 2016-05-27 (×8): 10 mg via ORAL
  Filled 2016-05-25 (×8): qty 1

## 2016-05-25 MED ORDER — NAPROXEN 250 MG PO TABS
500.0000 mg | ORAL_TABLET | Freq: Three times a day (TID) | ORAL | Status: DC
  Administered 2016-05-25 – 2016-05-28 (×9): 500 mg via ORAL
  Filled 2016-05-25 (×9): qty 2

## 2016-05-25 NOTE — Plan of Care (Signed)
Problem: [facility] Participation in Recreation Therapeutic Interventions Goal: STG-Patient will identify at least five coping skills for ** STG: Coping Skills - Within 4 treatment sessions, patient will verbalize at least 5 coping skills for substance abuse in each of 2 treatment sessions to decrease substance abuse post d/c.  Outcome: Progressing Treatment Session 1; Completed 1 out of 2: At approximately 2:35 pm, LRT met with patient in craft room. Patient verbalized 5 coping skills for substance abuse. LRT educated patient on leisure and why it is important to implement into his schedule. LRT educated and provided patient with blank schedules to help him plan his day and try to avoid using substances. LRT educated patient on healthy support systems.  Leonette Monarch, LRT/CTRS 08.04.17 3:23 pm Goal: STG-Other Recreation Therapy Goal (Specify) STG: Stress Management - Within 4 treatment sessions, patient will verbalize understanding of the stress management techniques in each of 2 treatment sessions to increase stress management skills post d/c.  Outcome: Progressing Treatment Session 1; Completed 1 out of 2: At approximately 2:35 pm, LRT met with patient in craft room. LRT educated and provided patient with handouts on stress management techniques. Patient verbalized understanding. LRT encouraged patient to read over and practice the stress management techniques.  Leonette Monarch, LRT/CTRS 08.04.17 3:25 pm

## 2016-05-25 NOTE — Progress Notes (Signed)
NUTRITION ASSESSMENT  Pt identified as at risk on the Malnutrition Screen Tool  INTERVENTION: 1. Reinforce the importance of nutrition and encourage intake of food and beverages. 2.Pt may benefit from MVI  NUTRITION DIAGNOSIS: Unintentional weight loss related to sub-optimal intake as evidenced by pt report.   Goal: Pt to meet >/= 90% of their estimated nutrition needs.  Monitor:  PO intake  Assessment:  35 y.o. male admitted with severe recurrent major depression without psychotic features, EtOh and Cocaine use disorder, positive for both on admission. Appetite currently good, eating 95-100% of meals  Height: Ht Readings from Last 1 Encounters:  05/23/16 5\' 9"  (1.753 m)    Weight: Wt Readings from Last 1 Encounters:  05/23/16 133 lb (60.3 kg)    Weight Hx: Wt Readings from Last 10 Encounters:  05/23/16 133 lb (60.3 kg)  05/22/16 149 lb (67.6 kg)    BMI:  Body mass index is 19.64 kg/m.  Estimated Nutritional Needs: Kcal: 25-30 kcal/kg Protein: > 1 gram protein/kg Fluid: 1 ml/kcal  Diet Order: Diet regular Room service appropriate? No; Fluid consistency: Thin Pt is also offered choice of scheduled unit snacks Pt is eating as desired. Recorded po intake 95-100% of meals  Lab results and medications reviewed.   Romelle Starcher MS, RD, LDN [phone] Pager  [phone] Weekend/On-Call Pager

## 2016-05-25 NOTE — Evaluation (Signed)
Occupational Therapy Evaluation Patient Details Name: Christopher Murray MRN: [id number] DOB: 1981/06/14 Today's Date: 05/25/2016    History of Present Illness Pt. is a 35 y.o. male who was admitted to [facility] with Severe Recurrent Major Depression without Psychotic Features. Pt. was referred to OT services following a deep laceration to the left forearm resulting in hand weakness and numbness/tingling.   Clinical Impression   Pt. Is a 35 y.o. Male who was admitted to [facility] with Severe Recurrent Major Depression without Psychotic Features. Pt. Presents with limited functional use in his left hand, pain in his forearm, decreased grip strenth, decreased pinch strength, impaired coordination, and sensory changes in his nondominant left hand which hinders his ability to use it functionally for ADL/IADL tasks. Pt. Could benefit from skilled OT services to improved grip strength, pinch strength, coordination, and sensation to be able to use it during functional ADL /IADL tasks. Pt. Could benefit from an outpatient OT Hand Therapy referral, and pt. Is an Personnel officer.    Follow Up Recommendations  Outpatient OT (Outpatient Hand Therapy referral)    Equipment Recommendations       Recommendations for Other Services       Precautions / Restrictions        Mobility Bed Mobility Overal bed mobility: Independent                Transfers Overall transfer level: Independent                    Balance Overall balance assessment: Independent                                          ADL Overall ADL's : Needs assistance/impaired                                       General ADL Comments: Pt. is able to complete self-care tasks with his dominant right hand. Pt. has difficulty holding, and using items in his left hand for self-feeding and grooming tasks.     Vision     Perception     Praxis      Pertinent Vitals/Pain Pain Assessment:  0-10 Pain Score: 7  Pain Location: left forearm     Hand Dominance Right   Extremity/Trunk Assessment    Pt. Is actively able to make a full composite fist. Pinch strength: lateral, R: 27#, L: 16#, 3pt. Pinch: R: 22#, L: 13#, 2pt. Pinch: R: 26#, L: 12#, Pt. Reports numbness/tingling in the thumb, 2nd, and 3rd digits along the median nerve distribution. Pt. Also Reports positive numbness at the 4th and 5th digits (however, this is less than at the thumb, 2nd and 3rd digits). Pt. Reports numbness at the left shoulder. Pt. has expressed changes in delineating temperature sensation in his left hand/digits.         Communication Communication Communication: No difficulties   Cognition Arousal/Alertness: Awake/alert Behavior During Therapy: WFL for tasks assessed/performed Overall Cognitive Status: Within Functional Limits for tasks assessed                     General Comments       Exercises       Shoulder Instructions      Home Living Family/patient expects to be discharged to:: Private  residence Living Arrangements: Parent                                      Prior Functioning/Environment Level of Independence: Independent (Working as an Personnel officer)             OT Diagnosis: Generalized weakness   OT Problem List: Decreased strength;Impaired UE functional use;Pain   OT Treatment/Interventions:      OT Goals(Current goals can be found in the care plan section) Acute Rehab OT Goals Patient Stated Goal: To regain the use of his hand to be able to work. OT Goal Formulation: With patient Potential to Achieve Goals: Good  OT Frequency:     Barriers to D/C:            Co-evaluation              End of Session    Activity Tolerance: Patient tolerated treatment well Patient left: with call bell/phone within reach   Time: 1345-1410 OT Time Calculation (min): 25 min Charges:  OT General Charges $OT Visit: 1 Procedure OT  Evaluation $OT Eval Moderate Complexity: 1 Procedure G-Codes:    Olegario Messier, MS, OTR/L Olegario Messier 05/25/2016, 3:03 PM

## 2016-05-25 NOTE — Progress Notes (Signed)
Recreation Therapy Notes  Date: 08.04.17 Time: 1:00 pm Location: Craft Room  Group Topic: Coping Skills  Goal Area(s) Addresses:  Patient will participate in healthy coping skill. Patient will verbalize benefit of using art as a coping skill.  Behavioral Response: Attentive, Left early  Intervention: Coloring  Activity: Patients were given coloring sheets to color and were instructed to think about the emotions they were feeling and what their mind was focused on.  Education: LRT educated patients on healthy coping skills.  Education Outcome: Patient left before LRT educated group.   Clinical Observations/Feedback: Patient colored coloring sheet. Patient left group at approximately 1:47 pm with the nurse to see OT. Patient did not return to group.  Jacquelynn Cree, LRT/CTRS 05/25/2016 2:10 PM

## 2016-05-25 NOTE — Plan of Care (Signed)
Problem: Coping: Goal: Ability to cope will improve Outcome: Progressing Pt appeared to be better due to pt telling writer his plans for when he D/C , pt appeared to be in no distress and was very talkative

## 2016-05-25 NOTE — Tx Team (Signed)
Interdisciplinary Treatment Plan Update (Adult)         Date: 05/25/2016   Time Reviewed: 10:30 AM   Progress in Treatment: Improving Attending groups: Yes  Participating in groups: Yes  Taking medication as prescribed: Yes  Tolerating medication: Yes  Family/Significant other contact made: Yes, CSW has spoken with mother Patient understands diagnosis: Yes  Discussing patient identified problems/goals with staff: Yes  Medical problems stabilized or resolved: Yes  Denies suicidal/homicidal ideation: Yes  Issues/concerns per patient self-inventory: Yes  Other:   New problem(s) identified: N/A   Discharge Plan or Barriers: see below   Reason for Continuation of Hospitalization:   Depression   Anxiety   Medication Stabilization   Comments: N/A   Estimated length of stay: 3-5 days    Patient is a 35 year old  male admitted for suicide attempt and suicidal ideation. Patient lives in [place], [place]. tells me he has been "flipping out" for about a month. He reports that he always had had issues with mental illness by his symptoms have been getting worse recently. Patient complains of having depressed mood and anxiety. He says that frequently he feels panicky and has severe anxiety that keeps him from eating. He says he sleeps about 4-5 hours at night, has been complaining of poor energy poor concentration, says that sometimes he just cannot remember simple things. He denies having any suicidal ideation prior to yesterday. He denies having any homicidal ideation. As far as auditory and visual hallucinations the patient stated that sometimes he sees things out of the corner of his eye. Patient will benefit from crisis stabilization, medication evaluation, group therapy, and psycho education in addition to case management for discharge planning. Patient and CSW reviewed pt's identified goals and treatment plan. Pt verbalized understanding and agreed to treatment plan.    Review of  initial/current patient goals per problem list:  1. Goal(s): Patient will participate in aftercare plan   Met: Yes  Target date: 3-5 days post admission date   As evidenced by: Patient will participate within aftercare plan AEB aftercare provider and housing plan at discharge being identified.   05/25/16: Patient will follow-up [place].  2. Goal (s): Patient will exhibit decreased depressive symptoms and suicidal ideations.   Met: Goal progressing  Target date: 3-5 days post admission date   As evidenced by: Patient will utilize self-rating of depression at 3 or below and demonstrate decreased signs of depression or be deemed stable for discharge by MD.   05/25/16: Pt denies SI/HI.  Pt reports he is safe for discharge. Patient reports a depression score of 4 at this time.   3. Goal(s): Patient will demonstrate decreased signs and symptoms of anxiety.   Met: Goal progressing  Target date: 3-5 days post admission date   As evidenced by: Patient will utilize self-rating of anxiety at 3 or below and demonstrated decreased signs of anxiety, or be deemed stable for discharge by MD   05/25/16: Pt. reports an anxiety of 5 at this time.   Attendees:  Patient: Christopher Murray Family:  Physician: Merlyn Albert, MD    05/25/2016 10:30 AM  Nursing: Elige Radon, RN     05/25/2016 10:30 AM  Clinical Social Worker: Glorious Peach, [place]  05/25/2016 10:30 AM

## 2016-05-25 NOTE — Progress Notes (Signed)
[facility] MD Progress Note  05/25/2016 11:33 AM Christopher Murray  MRN:  [id number] Subjective:Christopher Heavner Troxleris an 34 y.o.male. Pt presented to ED on 8/1 with a self-inflicted laceration on his arm. "Pt arrived to ED by EMS from fathers home after pt took a box cutter, with the intentions of harming self, and made a 4 inch vertical cut to the forearm, 1 1/2 inch deep. EMS reports pt cut forearm, ran into woods, police brought pt back to EMS. "  Patient requires sutures. His alcohol level at arrival was 257 and he was positive for cocaine.  Patient reports feeling very angry and anxious today. He says that he spoke with his father and his father has informed him he is not allowed to return to live there.  Patient is now homeless. Also patient fears that he might not have a job anymore. He says he is spoke with his father who has informed him that his boss is planning on allowing him to return to work as soon as he continues treatment for substance abuse.  Patient continues to have issues with  mood continues to be depressed. He no longer is having suicidal ideation.  Appetite has been improving. His sleep is is still fair to poor but he says is better than when he was prior to coming into the hospital. His attention and concentration are fair.  He denies any side effects from medications. As far as physical complaints he continues to have pain and numbness in the arm with the laceration.  Patient has been cooperative, calm and pleasant with nurses and peers. He has been actively participating in groups.  In addition the patient tells me that in the past he was diagnosed with OCD. He tells me that he has issues with counting things, he has to do things or to ways otherwise he fears that something bad could happen, he feels that he is very superstitious, he however denies spending a significant amount of time performing compulsions. He says that he does not interfere with his functioning at work or in his  day-to-day activities.  As there is not significant impairment of functioning he does not meet criteria for obsessive-compulsive disorder.  Patient complains of having pain however he feels that the pain self inflicted laceration) is well controlled with Tylenol and naproxen. He complains of having some difficulties grabbing things he said yesterday he was dropping things from his hand as he could not completely close it.  He said that that is getting better---OT consult ordered  Per nursing: Denies SI/HI/AVH.  Pleasant and cooperative.  Verbalizes that he is starting to feel better.  Dressing clean dry and intact. Support and encouragement offered.  Safety maintainted.   Principal Problem: Severe recurrent major depression without psychotic features (HCC) Diagnosis:   Patient Active Problem List   Diagnosis Date Noted  . Alcohol use disorder, severe, dependence (HCC) [F10.20] 05/23/2016  . Cocaine use disorder, severe, dependence (HCC) [F14.20] 05/23/2016  . Tobacco use disorder [F17.200] 05/23/2016  . Self-inflicted injury [F48.9] 05/23/2016  . Severe recurrent major depression without psychotic features (HCC) [F33.2] 05/23/2016  . Suicidal behavior [F48.9] 05/23/2016  . 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 Patient states that admission was also for self injury. He had cut his arm however he said he was not as severe as it was now. The patient was  here for about 3 days and he was then discharged.  [place] and was sober for about 2 years after discharge.  Past Medical History:  Past Medical History:  Diagnosis Date  . Scoliosis    History reviewed. No pertinent surgical history.  Family History: History reviewed.  No pertinent family history.  Family Psychiatric  History: He has multiple relatives in his family that suffer from alcoholism including father grandfather great uncle and many uncles   Social History: Patient is single, never married,. 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 He dropped out of high school but went back to school and completed a GED and then went back to college for 2 years. He denies having any legal charges currently but has had several DWIs in the past  History  Alcohol Use  . 3.6 oz/week  . 6 Cans of beer per week     History  Drug Use  . Frequency: 1.0 time per week  . Types: Cocaine, Marijuana    Social History   Social History  . Marital status: Single    Spouse name: N/A  . Number of children: N/A  . Years of education: N/A   Social History Main Topics  . Smoking status: Current Every Day Smoker    Packs/day: 1.00    Types: Cigarettes  . Smokeless tobacco: Never Used  . Alcohol use 3.6 oz/week    6 Cans of beer per week  . Drug use:     Frequency: 1.0 time per week    Types: Cocaine, Marijuana  . Sexual activity: No   Other Topics Concern  . None   Social History Narrative  . None   Additional Social History:    History of alcohol / drug use?: Yes Name of Substance 1: cocaine 1 - Frequency: once 1 - Last Use / Amount: past week         Current Medications: Current Facility-Administered Medications  Medication Dose Route Frequency Provider Last Rate Last Dose  . acetaminophen (TYLENOL) tablet 1,000 mg  1,000 mg Oral Q6H PRN Jimmy Footman, MD   1,000 mg at 05/23/16 2202  . alum & mag hydroxide-simeth (MAALOX/MYLANTA) 200-200-20 MG/5ML suspension 30 mL  30 mL Oral Q4H PRN Audery Amel, MD      . bacitracin ointment 1 application  1 application Topical BID Jimmy Footman, MD   1 application at 05/24/16 2145  . chlordiazePOXIDE (LIBRIUM) capsule 10 mg  10 mg Oral TID WC &  HS Jimmy Footman, MD      . FLUoxetine (PROZAC) capsule 10 mg  10 mg Oral Daily Jimmy Footman, MD   10 mg at 05/25/16 0924  . hydrOXYzine (ATARAX/VISTARIL) tablet 50 mg  50 mg Oral TID PRN Jimmy Footman, MD   50 mg at 05/25/16 0924  . magnesium hydroxide (MILK OF MAGNESIA) suspension 30 mL  30 mL Oral Daily PRN Audery Amel, MD      . naproxen (NAPROSYN) tablet 500 mg  500 mg Oral TID Jimmy Footman, MD      . nicotine (NICODERM CQ - dosed in mg/24 hours) patch 21 mg  21 mg Transdermal Daily Jimmy Footman, MD   21 mg at 05/24/16 1645  . pantoprazole (PROTONIX) EC tablet 40 mg  40 mg Oral BID AC Jimmy Footman, MD  40 mg at 05/25/16 0924  . traZODone (DESYREL) tablet 150 mg  150 mg Oral QHS Jimmy Footman, MD   150 mg at 05/24/16 2146    Lab Results:  No results found for this or any previous visit (from the past 48 hour(s)).  Blood Alcohol level:  Lab Results  Component Value Date   ETH 257 (H) 05/22/2016   ETH (H) 01/31/2011    300        LOWEST DETECTABLE LIMIT FOR SERUM ALCOHOL IS 5 mg/dL FOR MEDICAL PURPOSES ONLY    Metabolic Disorder Labs: No results found for: HGBA1C, MPG No results found for: PROLACTIN No results found for: CHOL, TRIG, HDL, CHOLHDL, VLDL, LDLCALC  Physical Findings: AIMS: Facial and Oral Movements Muscles of Facial Expression: None, normal Lips and Perioral Area: None, normal Jaw: None, normal Tongue: None, normal,Extremity Movements Upper (arms, wrists, hands, fingers): None, normal Lower (legs, knees, ankles, toes): None, normal, Trunk Movements Neck, shoulders, hips: None, normal, Overall Severity Severity of abnormal movements (highest score from questions above): None, normal Incapacitation due to abnormal movements: None, normal Patient's awareness of abnormal movements (rate only patient's report): No Awareness, Dental Status Current problems with teeth and/or  dentures?: No Does patient usually wear dentures?: No  CIWA:  CIWA-Ar Total: 3 COWS:     Musculoskeletal: Strength & Muscle Tone: within normal limits Gait & Station: normal Patient leans: N/A  Psychiatric Specialty Exam: Physical Exam  Constitutional: He is oriented to person, place, and time. He appears well-developed and well-nourished.  HENT:  Head: Normocephalic.  Eyes: Conjunctivae and EOM are normal.  Neck: Normal range of motion.  Respiratory: Effort normal.  Musculoskeletal: Normal range of motion.  Neurological: He is alert and oriented to person, place, and time.    Review of Systems  Constitutional: Negative.   HENT: Negative.   Respiratory: Negative.   Cardiovascular: Negative.   Gastrointestinal: Negative.   Genitourinary: Negative.   Musculoskeletal: Positive for joint pain.  Skin: Negative.   Neurological: Negative.   Endo/Heme/Allergies: Negative.   Psychiatric/Behavioral: Positive for depression and substance abuse. The patient is nervous/anxious and has insomnia.     Blood pressure 110/65, pulse 78, temperature 97.5 F (36.4 C), resp. rate 20, height 5\' 9"  (1.753 m), weight 60.3 kg (133 lb), SpO2 98 %.Body mass index is 19.64 kg/m.  General Appearance: Fairly Groomed  Eye Contact:  Good  Speech:  Clear and Coherent  Volume:  Normal  Mood:  Anxious and Dysphoric  Affect:  Appropriate and Congruent  Thought Process:  Linear and Descriptions of Associations: Intact  Orientation:  Full (Time, Place, and Person)  Thought Content:  Hallucinations: None  Suicidal Thoughts:  No  Homicidal Thoughts:  No  Memory:  Immediate;   Good Recent;   Good Remote;   Good  Judgement:  Fair  Insight:  Fair  Psychomotor Activity:  Decreased  Concentration:  Concentration: Good and Attention Span: Good  Recall:  Good  Fund of Knowledge:  Good  Language:  Good  Akathisia:  No  Handed:    AIMS (if indicated):     Assets:  Communication Skills Physical Health   ADL's:  Intact  Cognition:  WNL  Sleep:  Number of Hours: 6.15     Treatment Plan Summary:  MDD: Patient has been  started on fluoxetine 10 mg by mouth daily  For insomnia:continue trazodone to 150 mg by mouth daily at bedtime  For alcohol withdrawal: VS have been wnl.  No objective evidence  of withdrawal at this time.  Will decrease librium to 10 mg qid  Alcohol, cocaine, benzodiazepine use disorder: Patient will be referred to intensive outpatient substance abuse upon discharge  Tobacco use disorder : continue  nicotine patch 21 mg a day  Self-inflicted laceration: OT has been consulted today.  Continue bacitracin bid  Precautions every 15 minute checks  Hospitalization status continue involuntary commitment---will make voluntary today  Diet regular  Disposition: looking for half way house. SW has been assisting pt with this  Follow-up: to be determined depending on dispo  Possible d/c early next week---on August 7 or 8.  Will need 7 days free meds.  Also will need to have sutures removed (walk in to our ER) on 8/10  Jimmy Footman, MD 05/25/2016, 11:33 AM

## 2016-05-25 NOTE — [facility] Group Notes (Signed)
[facility] Group Notes:  (Nursing/MHT/Case Management/Adjunct)  Date:  05/25/2016  Time:  4:02 PM  Type of Therapy:  Psychoeducational Skills  Participation Level:  Active  Participation Quality:  Appropriate, Attentive and Supportive  Affect:  Appropriate  Cognitive:  Appropriate  Insight:  Appropriate  Engagement in Group:  Engaged and Supportive  Modes of Intervention:  Discussion and Education  Summary of Progress/Problems:  Christopher Murray 05/25/2016, 4:02 PM

## 2016-05-25 NOTE — Progress Notes (Signed)
Denies SI/HI/AVH.  Affect sad.  Verbalizes that he is anxious.  Affect is sad, becomes tearful as he discusses his plans and all the stressors in his life.  Unable to verbalize what triggers him to drink.  Support and encouragement offered.  Safety maintained.

## 2016-05-26 MED ORDER — FLUOXETINE HCL 20 MG PO CAPS
20.0000 mg | ORAL_CAPSULE | Freq: Every day | ORAL | Status: DC
  Administered 2016-05-26 – 2016-05-28 (×3): 20 mg via ORAL
  Filled 2016-05-26 (×3): qty 1

## 2016-05-26 NOTE — Progress Notes (Signed)
D:  Affect side.  Denies SI.  Endorses some depression.  Continues to struggle with anxiety.  Reports that he became upset because no one from the [facility] would talk to him because it was the weekend.  States "that does not sound like a place I should go to" A:  Support and encouragement offered.  Medicated x 2 for anxiety and x1 for constipation.  Scheduled medications given.  Q 15 minute rounding done.  R:  Receptive to treatment regiment.  Safety maintained.

## 2016-05-26 NOTE — [facility] Group Notes (Signed)
[facility] LCSW Group Therapy  05/26/2016 3:33 PM  Type of Therapy:  Group Therapy  Participation Level:  Minimal  Participation Quality:  Attentive  Affect:  Appropriate  Cognitive:  Alert  Insight:  Improving  Engagement in Therapy:  Limited  Modes of Intervention:  Activity, Discussion, Education and Support  Summary of Progress/Problems: Self care: Patients discussed self care and how it impacts them. Patients were asked to define self care in their own words. They discussed what aspects in their lives has influenced their self care. Patients also discussed self care in the areas of self regulation/control, hygiene/appearance, sleep/relaxation, healthy leisure, healthy eating habits, exercise, inner peace/spirituality, self improvement, sobriety, and health management. They were challenged to identify changes that are needed in order to improve self care. Pt was attentive during group and would engage when prompted by CSW. Pt was able to identify that he needs to work on his sobriety to improve his self-care. CSW offered support to pt for sharing with the group.   Christopher Murray MSW, LCSWA 05/26/2016, 3:34 PM

## 2016-05-26 NOTE — Progress Notes (Signed)
Denies SI/HI/AVH. Affect is depressed/sad. Pt stated that he is grateful that his employer is going to "look out for him" by letting him return to work after he gets further treatment. Visible in milieu with appropriate interaction among peers and staff. Attended evening group. Laceration to left arm assessed. No s/s of infection noted. Sutures intact. Bactracin ointment applied and dressing changed. Voices no other concerns at this time. Safety maintained.

## 2016-05-26 NOTE — Progress Notes (Signed)
Occupational Therapy Treatment Patient Details Name: Christopher Murray MRN: [id number] DOB: 04-Nov-1980 Today's Date: 05/26/2016    History of present illness Pt. is a 35 y.o. male who was admitted to [facility] with Severe Recurrent Major Depression without Psychotic Features. Pt. was referred to OT services following a deep laceration to the left forearm resulting in hand weakness and numbness/tingling.   OT comments  Patient seen for OT. Sat at table to perform task. Able to demonstrate HEP provided last OT session. Grip strength reassessed with LUE 55# and RUE 10#. Patient states he continues to have numbness and tingling in LUE, specifically over median nerve area of hand. States it feels like his hand is "asleep" with a pins and needles feeling. Patient also states pain increases over laceration area and in shoulder when doing multiple repetitions of exercises. Pain decreased with rest breaks. Educated patient on pacing, and taking several rest breaks prior to increase in pain level. Patient verbalized understanding. Patient performed pegboard with 22 pegs placed with LUE only in 58.21 sec, and removed in 45.67 sec. Same task with RUE was performed in 45.03 sec, and removed in 32.20 sec. Patient noted fatigue in LUE after task, and needed rest break. Performed other [facility] tasks by flipping cards. Able to do 10 before fatigue noted. Also, educated in tearing strips of paper, and rolling into a ball. Educated to perform as HEP, but continue to limit reps at this time based on fatigue/pain level. Patient verbalized understanding.   Follow Up Recommendations  Outpatient OT    Equipment Recommendations       Recommendations for Other Services      Precautions / Restrictions         Mobility Bed Mobility                  Transfers                      Balance                                   ADL Overall ADL's : Needs assistance/impaired                                       General ADL Comments: Pt. is able to complete self-care tasks with his dominant right hand. Pt. has difficulty holding, and using items in his left hand for self-feeding and grooming tasks.      Vision                     Perception     Praxis      Cognition   Behavior During Therapy: WFL for tasks assessed/performed Overall Cognitive Status: Within Functional Limits for tasks assessed                       Extremity/Trunk Assessment               Exercises Hand Activities Cards - Flip: Left;10 reps;Seated   Shoulder Instructions       General Comments      Pertinent Vitals/ Pain       Pain Assessment: 0-10 Pain Score: 6  Pain Location: left forearm Pain Intervention(s): Limited activity within patient's tolerance;Monitored during session  Home Living  Prior Functioning/Environment              Frequency Min 1X/week     Progress Toward Goals  OT Goals(current goals can now be found in the care plan section)  Progress towards OT goals: Progressing toward goals  Acute Rehab OT Goals Patient Stated Goal: To regain the use of his hand to be able to work. OT Goal Formulation: With patient Potential to Achieve Goals: Good  Plan Discharge plan remains appropriate    Co-evaluation                 End of Session     Activity Tolerance Patient tolerated treatment well   Patient Left with nursing/sitter in room   Nurse Communication          Time: 6433-2951 OT Time Calculation (min): 30 min  Charges: OT General Charges $OT Visit: 1 Procedure OT Treatments $Therapeutic Exercise: 23-37 mins  Gevin Perea L 05/26/2016, 2:06 PM  Kirstie Peri, OTR/L

## 2016-05-26 NOTE — [facility] Group Notes (Signed)
[facility] LCSW Group Therapy   05/26/2016 9:30 AM   Type of Therapy: Group Therapy   Participation Level: Active   Participation Quality: Attentive, Sharing and Supportive   Affect: Appropriate   Cognitive: Alert and Oriented   Insight: Developing/Improving and Engaged   Engagement in Therapy: Developing/Improving and Engaged   Modes of Intervention: Clarification, Confrontation, Discussion, Education, Exploration, Limit-setting, Orientation, Problem-solving, Rapport Building, Dance movement psychotherapist, Socialization and Support   Summary of Progress/Problems: The topic for today was feelings about relapse. Pt discussed what relapse prevention is to them and identified triggers that they are on the path to relapse. Pt processed their feeling towards relapse and was able to relate to peers. Pt discussed coping skills that can be used for relapse prevention. Pt shared that for the pt relapse meant "going back to using drugs and drinking".  Pt shared that he had been told that if he had waited 30 minutes to present to the ED, he would have "been dead".  Pt shared that in order for the pt to prevent relapse he must seek "help" through 12-step programs and change "people places and things". Pt said he was "afraid of relapse".   CSW actively validated the pt's opinion and provided feedback.  Pt was polite and cooperative with the CSW and other group members and focused and attentive to the topics discussed and the sharing of others.    Dorothe Pea. Prisilla Kocsis, MSW, LCSWA, LCAS

## 2016-05-26 NOTE — Progress Notes (Signed)
[facility] MD Progress Note  05/26/2016 7:30 AM Christopher Murray  MRN:  [id number] Subjective:  Christopher Murray is a 35 y.o. male patient admitted with "I cut myself".   Subjective:Christopher Murray is an 35 y.o. male. Pt presented to ED on 8/1 with a self-inflicted laceration on his arm.  "Pt arrived to ED by EMS from fathers home after pt took a box cutter, with the intentions of harming self, and made a 4 inch vertical cut to the forearm, 1 1/2 inch deep. EMS reports pt cut forearm, ran into woods, police brought pt back to EMS. "Prior to admission, the patient was drinking 12-18 beers after work, abusing benzodiazepines and also using cocaine intermittently. He had an argument with his boss selected him cutting his forearm.    The patient reports that he slept well last night, better than he has since he arrived on the unit but using a combination of hydroxyzine in a higher dose of trazodone 150 mg at bedtime. He does feel as if the hydroxyzine helped with his anxiety. He denies any current shakes or tremors but has been having some mild nausea. He denies any night sweats which have been occurring since he arrived on the unit. He has been on the Librium taper but has not needed any further Ativan. He denies any current active or passive suicidal thoughts and mood has slowly improved. He does regret cutting his arm and is hoping that his boss will continue to be understanding about his need for substance abuse treatment. He does have some anxiety but he cannot return to work immediately however. He did call the [employer] and the [facility] that is hoping to get a bed at one of the 2 after discharge and focus on treatment. He has been talking with his family but they have not yet come to visit. He says he does not want them to see him this way. He denies any current psychotic symptoms including auditory or visual hallucinations or paranoid thoughts or delusions. Appetite has improved and the patient  says he is now cleaning his plate. Appetite had been poor prior to admission. He does still have pain in his left forearm and has been getting naproxen. He has been working with OT and has had decreased control and grip in his left arm. Per nursing, he has been somewhat sad on the unit that he has been attending groups and the patient did identify some coping skills to help with anxiety. He does feel like he is getting benefit from groups.  Supportive psychotherapy provided and Times spent helping the patient to try and gain insight into triggers for anxiety. He says his boss and father are triggers for his anxiety. He says strangers do not trigger his anxiety, only close family members and friends.  So far he is tolerating the Prozac fairly well. He was agreeable to increasing the Prozac to a total 20 mg by mouth daily for depression and anxiety.   Substance abuse history: Long-standing history of alcohol abuse. Has had periods of sobriety in the past but recently has been drinking about 12 beers by his estimates a day and it has been going up. No history of alcohol withdrawal seizures. Intermittent use of cocaine.   Social history: Currently had been living with his father. Works as an Personnel officer. Sounds like his life is been a little chaotic recently.   Past Psychiatric History: Long-standing problems with mood and mood instability. He's been hospitalized in the  past. Had a suicide attempt around 2006 with a hospitalization. Hasn't been taking any medicine and about 10 years. He had a problem with allergy to Wellbutrin but no other medicines. Doesn't remember what other antidepressants he took. Does have a past history of suicide attempts. Long history of alcohol abuse.  Family Psychiatric History: None   Principal Problem: Severe recurrent major depression without psychotic features (HCC) Diagnosis:   Patient Active Problem List   Diagnosis Date Noted  . Alcohol use disorder, severe,  dependence (HCC) [F10.20] 05/23/2016  . Cocaine use disorder, severe, dependence (HCC) [F14.20] 05/23/2016  . Tobacco use disorder [F17.200] 05/23/2016  . Self-inflicted injury [F48.9] 05/23/2016  . Severe recurrent major depression without psychotic features (HCC) [F33.2] 05/23/2016  . Suicidal behavior [F48.9] 05/23/2016  . 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 Patient states that admission was also for self injury. He had cut his arm however he said he was not as severe as it was now. The patient was here for about 3 days and he was then discharged.  [place] and was sober for about 2 years after discharge.  Past Medical History:  Past Medical History:  Diagnosis Date  . Scoliosis    History reviewed. No pertinent surgical history.  Family History: History reviewed. No pertinent family history.  Family Psychiatric  History: He has multiple relatives in his family that suffer from alcoholism including father grandfather great uncle and many uncles   Social History: Patient is single, never married,. 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 He dropped out of high school but went back to school and completed a GED and then went back to college for 2 years. He denies having any legal charges currently but has had several DWIs in the past  History  Alcohol Use  . 3.6 oz/week  . 6 Cans of beer per week     History  Drug Use  . Frequency: 1.0 time per week  . Types: Cocaine, Marijuana    Social History   Social History  . Marital status: Single    Spouse name: N/A  . Number of children: N/A  . Years of education: N/A   Social History Main Topics  . Smoking status:  Current Every Day Smoker    Packs/day: 1.00    Types: Cigarettes  . Smokeless tobacco: Never Used  . Alcohol use 3.6 oz/week    6 Cans of beer per week  . Drug use:     Frequency: 1.0 time per week    Types: Cocaine, Marijuana  . Sexual activity: No   Other Topics Concern  . None   Social History Narrative  . None   Additional Social History:    History of alcohol / drug use?: Yes Name of Substance 1: cocaine 1 - Frequency: once 1 - Last Use / Amount: past week         Current Medications: Current Facility-Administered Medications  Medication Dose Route Frequency Provider Last Rate Last Dose  . acetaminophen (TYLENOL) tablet 1,000 mg  1,000 mg Oral Q6H PRN Jimmy Footman, MD   1,000 mg at 05/23/16 2202  . alum & mag hydroxide-simeth (MAALOX/MYLANTA) 200-200-20 MG/5ML suspension  30 mL  30 mL Oral Q4H PRN Audery Amel, MD      . bacitracin ointment 1 application  1 application Topical BID Jimmy Footman, MD   1 application at 05/25/16 2136  . chlordiazePOXIDE (LIBRIUM) capsule 10 mg  10 mg Oral TID WC & HS Jimmy Footman, MD   10 mg at 05/25/16 2135  . FLUoxetine (PROZAC) capsule 20 mg  20 mg Oral Daily Darliss Ridgel, MD      . hydrOXYzine (ATARAX/VISTARIL) tablet 50 mg  50 mg Oral TID PRN Jimmy Footman, MD   50 mg at 05/25/16 2154  . magnesium hydroxide (MILK OF MAGNESIA) suspension 30 mL  30 mL Oral Daily PRN Audery Amel, MD      . naproxen (NAPROSYN) tablet 500 mg  500 mg Oral TID Jimmy Footman, MD   500 mg at 05/25/16 2135  . nicotine (NICODERM CQ - dosed in mg/24 hours) patch 21 mg  21 mg Transdermal Daily Jimmy Footman, MD   21 mg at 05/25/16 1545  . pantoprazole (PROTONIX) EC tablet 40 mg  40 mg Oral BID AC Jimmy Footman, MD   40 mg at 05/25/16 1545  . traZODone (DESYREL) tablet 150 mg  150 mg Oral QHS Jimmy Footman, MD   150 mg at 05/25/16 2135    Lab Results:  No  results found for this or any previous visit (from the past 48 hour(s)).  Blood Alcohol level:  Lab Results  Component Value Date   ETH 257 (H) 05/22/2016   ETH (H) 01/31/2011    300        LOWEST DETECTABLE LIMIT FOR SERUM ALCOHOL IS 5 mg/dL FOR MEDICAL PURPOSES ONLY    Metabolic Disorder Labs: No results found for: HGBA1C, MPG No results found for: PROLACTIN No results found for: CHOL, TRIG, HDL, CHOLHDL, VLDL, LDLCALC  Physical Findings: AIMS: Facial and Oral Movements Muscles of Facial Expression: None, normal Lips and Perioral Area: None, normal Jaw: None, normal Tongue: None, normal,Extremity Movements Upper (arms, wrists, hands, fingers): None, normal Lower (legs, knees, ankles, toes): None, normal, Trunk Movements Neck, shoulders, hips: None, normal, Overall Severity Severity of abnormal movements (highest score from questions above): None, normal Incapacitation due to abnormal movements: None, normal Patient's awareness of abnormal movements (rate only patient's report): No Awareness, Dental Status Current problems with teeth and/or dentures?: No Does patient usually wear dentures?: No  CIWA:  CIWA-Ar Total: 3 COWS:     Musculoskeletal: Strength & Muscle Tone: within normal limits Gait & Station: normal Patient leans: N/A  Psychiatric Specialty Exam: Physical Exam  Constitutional: He is oriented to person, place, and time. He appears well-developed and well-nourished.  HENT:  Head: Normocephalic.  Eyes: Conjunctivae and EOM are normal.  Neck: Normal range of motion.  Respiratory: Effort normal.  Musculoskeletal: Normal range of motion.  Neurological: He is alert and oriented to person, place, and time.    Review of Systems  Constitutional: Negative.  Negative for chills, diaphoresis, fever, malaise/fatigue and weight loss.  HENT: Negative.  Negative for hearing loss, nosebleeds, sore throat and tinnitus.   Eyes: Negative for blurred vision, double vision  and redness.  Respiratory: Negative.  Negative for cough, hemoptysis, shortness of breath and wheezing.   Cardiovascular: Negative.  Negative for chest pain, palpitations and leg swelling.  Gastrointestinal: Positive for nausea. Negative for blood in stool, constipation, diarrhea and heartburn.  Genitourinary: Negative.   Musculoskeletal: Positive for joint pain. Negative for back pain, falls, myalgias  and neck pain.       The patient is struggling with pain in his left forearm secondary to self-inflicted laceration. No active bleeding. The patient does have a bandage on his left upper extremity which is getting changed on a daily basis.  Skin: Negative.  Negative for itching and rash.  Neurological: Positive for focal weakness. Negative for dizziness, tingling, tremors, weakness and headaches.       He does have decreased grip and sensation in his left upper extremity.  Endo/Heme/Allergies: Negative for environmental allergies. Does not bruise/bleed easily.  Psychiatric/Behavioral: Positive for depression and substance abuse. The patient is nervous/anxious and has insomnia.     Blood pressure 98/63, pulse 79, temperature 97.7 F (36.5 C), temperature source Oral, resp. rate 20, height 5\' 9"  (1.753 m), weight 60.3 kg (133 lb), SpO2 98 %.Body mass index is 19.64 kg/m.  General Appearance: Fairly Groomed  Eye Contact:  Good  Speech:  Clear and Coherent  Volume:  Normal  Mood:  Anxious and Dysphoric  Affect:  Mildly anxious  Thought Process:  Linear and Descriptions of Associations: Intact  Orientation:  Full (Time, Place, and Person)  Thought Content:  Hallucinations: None  Suicidal Thoughts:  No  Homicidal Thoughts:  No  Memory:  Immediate;   Good Recent;   Good Remote;   Good  Judgement:  Fair  Insight:  Fair  Psychomotor Activity:  Decreased  Concentration:  Concentration: Good and Attention Span: Good  Recall:  Good  Fund of Knowledge:  Good  Language:  Good  Akathisia:  No   Handed:    AIMS (if indicated):     Assets:  Communication Skills Physical Health  ADL's:  Intact  Cognition:  WNL  Sleep:  Number of Hours: 7     Treatment Plan Summary:  Major depressive disorder, recurrent: The patient has been started on Prozac at 10 mg by mouth daily which will be increased to total 20 mg by mouth daily. He is also now on trazodone which was increased to total 150 mg by mouth nightly for insomnia. The patient was also started on hydroxyzine 50 mg by mouth 3 times a day for anxiety  For alcohol withdrawal: VS have been wnl.  No objective evidence of withdrawal at this time.  He is currently on a Librium taper and Librium as at 10 mg by mouth 3 times a day today. We'll continue to decrease Librium. The patient was advised to abstain from alcohol and also drugs as they may worsen mood symptoms.  Alcohol, cocaine, benzodiazepine use disorder: Patient will be referred to intensive outpatient substance abuse upon discharge. He was advised to abstain from alcohol and all illicit drugs as they may worsen mood symptoms. He is interested in outpatient substance abuse treatment.  Tobacco use disorder : continue  nicotine patch 21 mg a day  Self-inflicted laceration: OT has been working with the patient. Continue bacitracin twice a day and dressing changes daily.   Suicide Precautions every 15 minute checks  Hospitalization : He was changed from involuntary to voluntary status.  Diet regular  Disposition: The patient is interested in outpatient substance abuse treatment and will need to follow-up with [facility] after discharge. He is interested in possibly going to the [facility]   Follow-up: to be determined depending on dispo  Possible d/c early next week---on August 7 or 8.  Will need 7 days free meds.  Also will need to have sutures removed (walk in to  our ER) on 8/10  Levora Angel, MD 05/26/2016, 7:30 AM

## 2016-05-27 MED ORDER — CHLORDIAZEPOXIDE HCL 10 MG PO CAPS
10.0000 mg | ORAL_CAPSULE | Freq: Two times a day (BID) | ORAL | Status: DC
  Administered 2016-05-27 – 2016-05-28 (×2): 10 mg via ORAL
  Filled 2016-05-27 (×2): qty 1

## 2016-05-27 NOTE — [facility] Group Notes (Signed)
[facility] Group Notes:  (Nursing/MHT/Case Management/Adjunct)  Date:  05/27/2016  Time:  9:41 AM  Type of Therapy:  goal setting  Participation Level:  Active  Participation Quality:  Appropriate, Attentive and Supportive  Affect:  Appropriate  Cognitive:  Appropriate  Insight:  Appropriate  Engagement in Group:  Engaged and Supportive  Modes of Intervention:  goal setting  Summary of Progress/Problems:  Twanna Hymanda C Amarria Andreasen 05/27/2016, 9:41 AM

## 2016-05-27 NOTE — Progress Notes (Addendum)
[facility] MD Progress Note  05/27/2016 4:26 PM [name]  MRN:  [id number] Subjective:  Christopher Murray is a 35 y.o. male patient admitted with "I cut myself".   Subjective:Christopher Murray is an 35 y.o. male. Pt presented to ED on 8/1 with a self-inflicted laceration on his arm.  "Pt arrived to ED by EMS from fathers home after pt took a box cutter, with the intentions of harming self, and made a 4 inch vertical cut to the forearm, 1 1/2 inch deep. EMS reports pt cut forearm, ran into woods, police brought pt back to EMS. "Prior to admission, the patient was drinking 12-18 beers after work, abusing benzodiazepines and also using cocaine intermittently. He had an argument with his boss selected him cutting his forearm.   The patient reports that yesterday he did get irritable after talking to his father. His father is a trigger for him. He did have some anxiety and nausea after the conversation with his father and had to get hydroxyzine to help with anxiety. Overall, he is anxious over where he will live after discharge as well as the recovery of his arm. The patient got upset when he was told occupational therapy that it may take 6 months to a year for the nerves in his arm to grow back to heal fully. The patient is worried about his occupation and where he will be able to find work if his boss does not take him back. He denies any current active or passive suicidal thoughts or psychotic symptoms. He was able to sleep over 7 hours last night. Appetite is good. Although he does have some nausea, he denies any shakes or tremors. Librium taper was continued and decreased today to 10 mg by mouth twice daily. The patient yells like groups have been helpful as well. He denies any new somatic complaints and wound is healing but he does still struggle with chronic pain in his left upper extremity. He has continued to work with occupational therapy. He was [facility]  until after Monday.  Supportive psychotherapy provided and Times spent discussing anger management and coping skills to help with anxiety and irritability. He was encouraged to try and use deep breathing exercises and distraction techniques when feeling anxious.   Substance abuse history: Long-standing history of alcohol abuse. Has had periods of sobriety in the past but recently has been drinking about 12 beers by his estimates a day and it has been going up. No history of alcohol withdrawal seizures. Intermittent use of cocaine.   Social history: Currently had been living with his father. Works as an Personnel officer. He is unable to return to live with his father and therefore is currently homeless. He is never been married and has no children. He last worked at [employer]. His boss will only let him return if he is sober.    Past Psychiatric History: Long-standing problems with mood and mood instability. He's been hospitalized in the past. Had a suicide attempt around 2006 with a hospitalization. Hasn't been taking any medicine and about 10 years. He had a problem with allergy to Wellbutrin but no other medicines. Doesn't remember what other antidepressants he took. Does have a past history of suicide attempts. Long history of alcohol abuse.  Family Psychiatric History: None   Principal Problem: Severe recurrent major depression without psychotic features (HCC) Diagnosis:   Patient Active Problem List   Diagnosis Date Noted  . Alcohol use  disorder, severe, dependence (HCC) [F10.20] 05/23/2016  . Cocaine use disorder, severe, dependence (HCC) [F14.20] 05/23/2016  . Tobacco use disorder [F17.200] 05/23/2016  . Self-inflicted injury [F48.9] 05/23/2016  . Severe recurrent major depression without psychotic features (HCC) [F33.2] 05/23/2016  . Suicidal behavior [F48.9] 05/23/2016  . 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 Patient states that admission was also for self injury. He had cut his arm however he said he was not as severe as it was now. The patient was here for about 3 days and he was then discharged.  [place] and was sober for about 2 years after discharge.  Past Medical History:  Past Medical History:  Diagnosis Date  . Scoliosis    History reviewed. No pertinent surgical history.  Family History: History reviewed. No pertinent family history.  Family Psychiatric  History: He has multiple relatives in his family that suffer from alcoholism including father grandfather great uncle and many uncles   Social History: Patient is single, never married,. 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 He dropped out of high school but went back to school and completed a GED and then went back to college for 2 years. He denies having any legal charges currently but has had several DWIs in the past  History  Alcohol Use  . 3.6 oz/week  . 6 Cans of beer per week     History  Drug Use  . Frequency: 1.0 time per week  . Types: Cocaine, Marijuana    Social History   Social History  . Marital status: Single    Spouse name: N/A  . Number of children: N/A  . Years of education: N/A   Social History Main Topics  . Smoking status: Current Every Day Smoker    Packs/day: 1.00    Types: Cigarettes  . Smokeless tobacco: Never Used  . Alcohol use 3.6 oz/week    6 Cans of beer per week  . Drug use:     Frequency: 1.0 time per week    Types: Cocaine, Marijuana  . Sexual activity: No   Other Topics Concern  . None   Social History Narrative  . None   Additional Social History:    History of alcohol / drug use?: Yes Name of Substance 1: cocaine 1 -  Frequency: once 1 - Last Use / Amount: past week         Current Medications: Current Facility-Administered Medications  Medication Dose Route Frequency Provider Last Rate Last Dose  . acetaminophen (TYLENOL) tablet 1,000 mg  1,000 mg Oral Q6H PRN Jimmy Footman, MD   1,000 mg at 05/23/16 2202  . alum & mag hydroxide-simeth (MAALOX/MYLANTA) 200-200-20 MG/5ML suspension 30 mL  30 mL Oral Q4H PRN Audery Amel, MD      . bacitracin ointment 1 application  1 application Topical BID Jimmy Footman, MD   1 application at 05/27/16 0900  . chlordiazePOXIDE (LIBRIUM) capsule 10 mg  10 mg Oral BID Darliss Ridgel, MD   10 mg at 05/27/16 1604  . FLUoxetine (PROZAC) capsule 20 mg  20 mg Oral Daily Darliss Ridgel, MD   20 mg at 05/27/16 0859  .  hydrOXYzine (ATARAX/VISTARIL) tablet 50 mg  50 mg Oral TID PRN Jimmy Footman, MD   50 mg at 05/27/16 1605  . magnesium hydroxide (MILK OF MAGNESIA) suspension 30 mL  30 mL Oral Daily PRN Audery Amel, MD   30 mL at 05/27/16 0911  . naproxen (NAPROSYN) tablet 500 mg  500 mg Oral TID Jimmy Footman, MD   500 mg at 05/27/16 1604  . nicotine (NICODERM CQ - dosed in mg/24 hours) patch 21 mg  21 mg Transdermal Daily Jimmy Footman, MD   21 mg at 05/27/16 1604  . pantoprazole (PROTONIX) EC tablet 40 mg  40 mg Oral BID AC Jimmy Footman, MD   40 mg at 05/27/16 1604  . traZODone (DESYREL) tablet 150 mg  150 mg Oral QHS Jimmy Footman, MD   150 mg at 05/26/16 2116    Lab Results:  No results found for this or any previous visit (from the past 48 hour(s)).  Blood Alcohol level:  Lab Results  Component Value Date   ETH 257 (H) 05/22/2016   ETH (H) 01/31/2011    300        LOWEST DETECTABLE LIMIT FOR SERUM ALCOHOL IS 5 mg/dL FOR MEDICAL PURPOSES ONLY    Metabolic Disorder Labs: No results found for: HGBA1C, MPG No results found for: PROLACTIN No results found for: CHOL, TRIG, HDL,  CHOLHDL, VLDL, LDLCALC  Physical Findings: AIMS: Facial and Oral Movements Muscles of Facial Expression: None, normal Lips and Perioral Area: None, normal Jaw: None, normal Tongue: None, normal,Extremity Movements Upper (arms, wrists, hands, fingers): None, normal Lower (legs, knees, ankles, toes): None, normal, Trunk Movements Neck, shoulders, hips: None, normal, Overall Severity Severity of abnormal movements (highest score from questions above): None, normal Incapacitation due to abnormal movements: None, normal Patient's awareness of abnormal movements (rate only patient's report): No Awareness, Dental Status Current problems with teeth and/or dentures?: No Does patient usually wear dentures?: No  CIWA:  CIWA-Ar Total: 3 COWS:     Musculoskeletal: Strength & Muscle Tone: within normal limits Gait & Station: normal Patient leans: N/A  Psychiatric Specialty Exam: Physical Exam  Constitutional: He is oriented to person, place, and time. He appears well-developed and well-nourished.  HENT:  Head: Normocephalic.  Eyes: Conjunctivae and EOM are normal.  Neck: Normal range of motion.  Respiratory: Effort normal.  Musculoskeletal: Normal range of motion.  Neurological: He is alert and oriented to person, place, and time.    Review of Systems  Constitutional: Negative.  Negative for chills, diaphoresis, fever, malaise/fatigue and weight loss.  HENT: Negative.  Negative for hearing loss, nosebleeds, sore throat and tinnitus.   Eyes: Negative for blurred vision, double vision and redness.  Respiratory: Negative.  Negative for cough, hemoptysis, shortness of breath and wheezing.   Cardiovascular: Negative.  Negative for chest pain, palpitations and leg swelling.  Gastrointestinal: Positive for nausea. Negative for blood in stool, constipation, diarrhea and heartburn.  Genitourinary: Negative.   Musculoskeletal: Positive for joint pain. Negative for back pain, falls, myalgias and  neck pain.       The patient is struggling with pain in his left forearm secondary to self-inflicted laceration. No active bleeding. The patient does have a bandage on his left upper extremity which is getting changed on a daily basis.  Skin: Negative.  Negative for itching and rash.  Neurological: Positive for focal weakness. Negative for dizziness, tingling, tremors, weakness and headaches.       He does have decreased grip and sensation  in his left upper extremity.  Endo/Heme/Allergies: Negative for environmental allergies. Does not bruise/bleed easily.  Psychiatric/Behavioral: Positive for depression and substance abuse. The patient is nervous/anxious and has insomnia.     Blood pressure 100/63, pulse 74, temperature 97.6 F (36.4 C), temperature source Oral, resp. rate 20, height 5\' 9"  (1.753 m), weight 60.3 kg (133 lb), SpO2 98 %.Body mass index is 19.64 kg/m.  General Appearance: Fairly Groomed  Eye Contact:  Good  Speech:  Clear and Coherent  Volume:  Normal  Mood:  Anxious and Dysphoric  Affect:  Mildly anxious  Thought Process:  Linear and Descriptions of Associations: Intact  Orientation:  Full (Time, Place, and Person)  Thought Content:  Hallucinations: None  Suicidal Thoughts:  No  Homicidal Thoughts:  No  Memory:  Immediate;   Good Recent;   Good Remote;   Good  Judgement:  Fair  Insight:  Fair  Psychomotor Activity:  Decreased  Concentration:  Concentration: Good and Attention Span: Good  Recall:  Good  Fund of Knowledge:  Good  Language:  Good  Akathisia:  No  Handed:    AIMS (if indicated):     Assets:  Communication Skills Physical Health  ADL's:  Intact  Cognition:  WNL  Sleep:  Number of Hours: 7.45     Treatment Plan Summary:  Major depressive disorder, recurrent: The patient has been started on Prozac at 10 mg by mouth daily which was increased to total 20 mg by mouth daily. He is also now on trazodone which was increased to total 150 mg by mouth  nightly for insomnia. The patient was also started on hydroxyzine 50 mg by mouth 3 times a day for anxiety  For alcohol withdrawal: VS have been wnl.  No objective evidence of withdrawal at this time.  He is currently on a Librium taper and Librium will be decreased to 2 times a day today. We'll continue to decrease Librium. The patient was advised to abstain from alcohol and also drugs as they may worsen mood symptoms.  Alcohol, cocaine, benzodiazepine use disorder: Patient will be referred to intensive outpatient substance abuse upon discharge. He was advised to abstain from alcohol and all illicit drugs as they may worsen mood symptoms. He is interested in outpatient substance abuse treatment.  GERD: Continue Pantaprozole 40mg  po BID  Tobacco use disorder : continue  nicotine patch 21 mg a day  Self-inflicted laceration: OT has been working with the patient. Continue bacitracin twice a day and dressing changes daily. No signs of infection with dressing changes.  Suicide Precautions every 15 minute checks  Hospitalization : He was changed from involuntary to voluntary status.  Diet regular  Disposition: The patient is interested in outpatient substance abuse treatment and will need to follow-up with [facility] after discharge. He is [facility]   Follow-up: to be determined depending on dispo  Possible d/c early next week---on August 7 or 8.  Will need 7 days free meds.  Also will need to have sutures removed (walk in to our ER) on 8/10  Levora AngelKAPUR,AARTI KAMAL, MD 05/27/2016, 4:26 PM

## 2016-05-27 NOTE — Progress Notes (Signed)
Pt denies SI/HI/AVH. Affect irritable after phone conversation with father. Pt slammed phone down. Was able to calm self. Given Vistaril for anxiety. Pt later apologized for behavior. Attended evening wrap up group. Appropriate with staff and peers. Voices no additional concerns at this time. Safety maintained. Will continue to monitor.

## 2016-05-27 NOTE — [facility] Group Notes (Signed)
[facility] LCSW Group Therapy  05/27/2016 2:26 PM  Type of Therapy:  Group Therapy  Participation Level:  Pt did not attend group. CSW invited pt to group.   Summary of Progress/Problems: Emotional Regulation: Patients will identify both negative and positive emotions. They will discuss emotions they have difficulty regulating and how they impact their lives. Patients will be asked to identify healthy coping skills to combat unhealthy reactions to negative emotions. The group focused on the anger and how to manage anger appropriately.   Rushton Early G. Garnette CzechSampson MSW, LCSWA 05/27/2016, 2:26 PM

## 2016-05-27 NOTE — Progress Notes (Signed)
D: Pt complains of anxiety this am due to he and fathers phone conversations. Report father upsets him. Pt was pleasant and cooperative with staff and peers. Pt request prn for anxiety. Denies SI, HI, AVH. Eating meals well, but concerned of plans after discharge. A: Encouragement and support offered. PRN given for anxiety with good relief.  R: Pt took meds as prescribed, attends group. Remains safe on unit with q 15 min checks.

## 2016-05-27 NOTE — Plan of Care (Signed)
Problem: Safety: Goal: Ability to identify and utilize support systems that promote safety will improve Outcome: Progressing Pt is participating in treatment

## 2016-05-27 NOTE — [facility] Group Notes (Signed)
[facility] Group Notes:  (Nursing/MHT/Case Management/Adjunct)  Date:  05/27/2016  Time:  11:07 PM  Type of Therapy:  Group Therapy  Participation Level:  Active  Participation Quality:  Appropriate  Affect:  Appropriate  Cognitive:  Appropriate  Insight:  Appropriate  Engagement in Group:  Engaged  Modes of Intervention:  n/a  Summary of Progress/Problems:  Veva Holesshley Imani Marcoantonio Legault 05/27/2016, 11:07 PM

## 2016-05-27 NOTE — Plan of Care (Signed)
Problem: Coping: Goal: Ability to verbalize frustrations and anger appropriately will improve Outcome: Progressing Patient able to verbalize he and fathers conversation causing him anger and anxiety

## 2016-05-28 MED ORDER — FLUOXETINE HCL 20 MG PO CAPS
20.0000 mg | ORAL_CAPSULE | Freq: Every day | ORAL | Status: DC
  Filled 2016-05-28: qty 7

## 2016-05-28 MED ORDER — HYDROXYZINE HCL 50 MG PO TABS
50.0000 mg | ORAL_TABLET | Freq: Three times a day (TID) | ORAL | Status: DC | PRN
  Filled 2016-05-28 (×2): qty 21

## 2016-05-28 MED ORDER — FLUOXETINE HCL 20 MG PO CAPS: 20.0000 mg | ORAL_CAPSULE | Freq: Every day | ORAL | 1 refills | Status: AC

## 2016-05-28 MED ORDER — TRAZODONE HCL 150 MG PO TABS: 150.0000 mg | ORAL_TABLET | Freq: Every day | ORAL | 1 refills | Status: AC

## 2016-05-28 MED ORDER — PANTOPRAZOLE SODIUM 40 MG PO TBEC: 40.0000 mg | DELAYED_RELEASE_TABLET | Freq: Two times a day (BID) | ORAL | 1 refills | Status: AC

## 2016-05-28 MED ORDER — FLUOXETINE HCL 20 MG PO CAPS: 20.0000 mg | ORAL_CAPSULE | Freq: Every day | ORAL | 1 refills | Status: DC

## 2016-05-28 MED ORDER — HYDROXYZINE HCL 50 MG PO TABS: 50.0000 mg | ORAL_TABLET | Freq: Three times a day (TID) | ORAL | 1 refills | Status: DC | PRN

## 2016-05-28 MED ORDER — NAPROXEN 500 MG PO TABS: 500.0000 mg | ORAL_TABLET | Freq: Three times a day (TID) | ORAL | 1 refills | Status: DC

## 2016-05-28 MED ORDER — NAPROXEN 500 MG PO TABS
500.0000 mg | ORAL_TABLET | Freq: Three times a day (TID) | ORAL | Status: DC
  Filled 2016-05-28: qty 21

## 2016-05-28 MED ORDER — TRAZODONE HCL 50 MG PO TABS
150.0000 mg | ORAL_TABLET | Freq: Every day | ORAL | Status: DC
  Filled 2016-05-28: qty 21

## 2016-05-28 MED ORDER — TRAZODONE HCL 150 MG PO TABS: 150.0000 mg | ORAL_TABLET | Freq: Every day | ORAL | 1 refills | Status: DC

## 2016-05-28 MED ORDER — PANTOPRAZOLE SODIUM 40 MG PO TBEC: 40.0000 mg | DELAYED_RELEASE_TABLET | Freq: Two times a day (BID) | ORAL | 1 refills | Status: DC

## 2016-05-28 MED ORDER — PANTOPRAZOLE SODIUM 40 MG PO TBEC
40.0000 mg | DELAYED_RELEASE_TABLET | Freq: Two times a day (BID) | ORAL | Status: DC
  Filled 2016-05-28: qty 14

## 2016-05-28 NOTE — Progress Notes (Signed)
Occupational Therapy Treatment Patient Details Name: Christopher Murray MRN: [id number] DOB: [birth year] Today's Date: 05/28/2016    History of present illness Pt. is a 35 y.o. male who was admitted to [facility] with Severe Recurrent Major Depression without Psychotic Features. Pt. was referred to OT services following a deep laceration to the left forearm resulting in hand weakness and numbness/tingling.   OT comments  Pt. continues to present with left forearm pain (7/10), changes in sensation in the LUE with increased tingling along the median nerve, weakness in grip and pinch strength, and impaired [facility] skills. Pt. education was provided about LUE functional use during ADLs, positioning, and work simplification strategies. Pt. education was provided about green theraputty ex. Pt. was able to demonstrate proper technique with verbal cues, and visual demonstration for gross grip, lateral, 3pt., and 2pt. Pinch strength, thumb opposition to the tip of the 2nd through 5th digits, thumb abduction, and gross digit extension. Pt. Could benefit from follow up with theraputty ex. Supervision and constant monitoring was provided with the theraputty. (Putty was not left with pt., and was removed at the end of the session with the therapist).   Follow Up Recommendations  Outpatient OT    Equipment Recommendations       Recommendations for Other Services      Precautions / Restrictions         Mobility Bed Mobility                  Transfers Overall transfer level: Independent                    Balance Overall balance assessment: Independent                                 ADL                                         General ADL Comments: Pt. continues to have difficulty using his left hand to assist with ADL tasks. Pt. reports trying to hold more items, and is trying to use his left hand to assist his right hand during ADL tasks. Pt. education was  provided about positioning of his left hand, and work simplification strategies.      Vision                     Perception     Praxis      Cognition   Behavior During Therapy: WFL for tasks assessed/performed Overall Cognitive Status: Within Functional Limits for tasks assessed                       Extremity/Trunk Assessment               Exercises     Shoulder Instructions       General Comments      Pertinent Vitals/ Pain       Pain Assessment: 0-10 Pain Score: 7  Pain Location: left forearm  Home Living                                          Prior Functioning/Environment  Frequency Min 1X/week     Progress Toward Goals  OT Goals(current goals can now be found in the care plan section)  Progress towards OT goals: Progressing toward goals  Acute Rehab OT Goals Patient Stated Goal: To regain the use of his hand to be able to work. OT Goal Formulation: With patient Potential to Achieve Goals: Good  Plan Discharge plan remains appropriate    Co-evaluation                 End of Session     Activity Tolerance Patient tolerated treatment well   Patient Left     Nurse Communication          Time: [id number] OT Time Calculation (min): 23 min  Charges: OT General Charges $OT Visit: 1 Procedure OT Treatments $Self Care/Home Management : 23-37 mins   Olegario MessierElaine Anah Billard, MS, OTR/L  Olegario Messierlaine [place] Droll 05/28/2016, 9:58 AM

## 2016-05-28 NOTE — Progress Notes (Signed)
  [facility] :  Will you be returning to the same living situation after discharge:  No. Will stay with friend. At discharge, do you have transportation home?: Yes,  pt will be picked up by a friend Do you have the ability to pay for your medications: No.  Release of information consent forms completed and in the chart;  Patient's signature needed at discharge.  Patient to Follow up at: Follow-up Information    [facility]. Go on 05/30/2016.   Why:  Please arrive to the walk-in clinic between the hours of 8am-2:30pm for an assessment for medication management and therapy.  Arrive as early as possible for prompt service.  Please call Christopher Murray at [phone] for questions and assistance. Contact information: [facility] [place] [address] [place] [address] Ph: [phone] Fax: [phone]          Next level of care provider has access to [facility] Link:no  Safety Planning and Suicide Prevention discussed: Yes,  reviewed SPE with pt and patient  Have you used any form of tobacco in the last 30 days? (Cigarettes, Smokeless Tobacco, Cigars, and/or Pipes): Yes  Has patient been referred to the Quitline?: Patient refused referral  Patient has been referred for addiction treatment: Pt. refused referral  Christopher Murray, MSW, LCSW-A 05/28/2016, 9:57 AM

## 2016-05-28 NOTE — Progress Notes (Signed)
Patient discharged home. DC instructions provided and explained. Medications reviewed. Rx given. All questions answered. Dressing to arm changed prior to discharge with instructions to have stitches removed on 8/10 on discharge instructions. Pt denies SI, HI, AVH. All questions answered. Belongings returned. Pt stable at discharge, hopeful and complimentary of staff.

## 2016-05-28 NOTE — Progress Notes (Signed)
Pt denies SI/HI/AVH. Attended evening wrap up group. Visible in milieu socializing with peers. Appropriate during interaction. Affect anxious at times. PRN vistaril given for anxiety with 2200 medications. Voices no additional concerns at this time. Safety maintained.

## 2016-05-28 NOTE — Discharge Instructions (Signed)
Stitches to left forearm laceration need to be removed on 05/31/2016.

## 2016-05-28 NOTE — Discharge Summary (Signed)
Physician Discharge Summary Note  Patient:  Christopher Murray is an 35 y.o., male MRN:  [id number] DOB:  [birth year] Patient phone:  [phone] (home)  Patient address:   [address] [place] [address],  Total Time spent with patient: 30 minutes  Date of Admission:  05/23/2016 Date of Discharge: 05/28/2016  Reason for Admission:  Suicide attempt.  Christopher CraneWesley S Troxleris an 34 y.o.male. Pt presented to ED on 8/1 with a self-inflicted laceration on his arm. "Pt arrived to ED by EMS from fathers home after pt took a box cutter, with the intentions of harming self, and made a 4 inch vertical cut to the forearm, 1 1/2 inch deep. EMS reports pt cut forearm, ran into woods, police brought pt back to EMS. "  Patient requires sutures. His alcohol level at arrival was 257 and he was positive for cocaine.  Patient tells me he has been "flipping out" for about a month. He reports that he always had had issues with mental illness by his symptoms have been getting worse recently. Patient complains of having depressed mood and anxiety. He says that frequently he feels panicky and has severe anxiety that keeps him from eating. He says he sleeps about 4-5 hours at night, has been complaining of poor energy poor concentration, says that sometimes he just cannot remember simple things. He denies having any suicidal ideation prior to yesterday. He denies having any homicidal ideation. As far as auditory and visual hallucinations the patient stated that sometimes he sees things out of the corner of his eye..  Patient says that he has worked as an Personnel officerelectrician for many years. He has worked for the same employer for years. He says that is not refer them to get into verbal active medications. Yesterday he had a verbal altercation with his boss over the phone, he said that they were in disagreement about some work that needed to be done at a house. After the argument the patient grabbed a box cutter and cut his  arm with the intention of ending his life.  His father found him and called EMS. The patient ran away into the woods as he did not want to be brought back to the hospital. Today he feels regretful about what he did. He wants to work on his depression and recognizes that he needs help.  As far as substance abuse the patient tells me that he drinks about a 12 pack of beers daily and has done so for the last 5 years. The patient has never S prevented withdrawal symptoms. He does report having eye openers on the weekends but denies blackouts. He also uses cocaine once in a while he said that he used cocaine a week ago and he used cocaine prior to that back in December. He has been using alprazolam or clonazepam twice per week which he gets from his neighbors. He feels that the alprazolam helps with his anxiety and issues with insomnia.  He recognizes his substance abuse is in remission and would like help to address it. Patient has had 3 DWIs in the past. He does not have a driver's license at this time. He has been in inpatient treatment for substance abuse once 10 years ago.  Patient also smokes about one pack of cigarettes per day  Associated Signs/Symptoms: Depression Symptoms:  depressed mood, insomnia, fatigue, suicidal attempt, anxiety, loss of energy/fatigue, decreased appetite, (Hypo) Manic Symptoms:  Impulsivity, Irritable Mood, Anxiety Symptoms:  Panic Symptoms, Psychotic Symptoms:  denies PTSD Symptoms: NA  Past Psychiatric History: Patient was hospitalized in our unit several years ago. Patient states that admission was also for self injury. He had cut his arm however he said he was not as severe as it was now. The patient was here for about 3 days and he was then discharged.  [place] and was sober for about 2 years after discharge.  Family Psychiatric  History: He  has multiple relatives in his family that suffer from alcoholism including father grandfather great uncle and many uncles   Social History: Patient is single, never married,. 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 He dropped out of high school but went back to school and completed a GED and then went back to college for 2 years. He denies having any legal charges currently but has had several DWIs in the past   Principal Problem: Major depressive disorder, recurrent severe without psychotic features [facility]) Discharge Diagnoses: Patient Active Problem List   Diagnosis Date Noted  . Alcohol use disorder, severe, dependence (HCC) [F10.20] 05/23/2016  . Cocaine use disorder, severe, dependence (HCC) [F14.20] 05/23/2016  . Tobacco use disorder [F17.200] 05/23/2016  . Self-inflicted injury [F48.9] 05/23/2016  . Major depressive disorder, recurrent severe without psychotic features (HCC) [F33.2] 05/23/2016  . Suicidal behavior [F48.9] 05/23/2016  . 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 Scoliosis    History reviewed. No pertinent surgical history. Family History: History reviewed. No pertinent family history.  Social History:  History  Alcohol Use  . 3.6 oz/week  . 6 Cans of beer per week     History  Drug Use  . Frequency: 1.0 time per week  . Types: Cocaine, Marijuana    Social History   Social History  . Marital status: Single    Spouse name: N/A  . Number of children: N/A  . Years of education: N/A   Social History Main Topics  . Smoking status: Current Every Day Smoker    Packs/day: 1.00    Types: Cigarettes  . Smokeless tobacco: Never Used  . Alcohol use 3.6 oz/week    6 Cans of beer per week  . Drug use:     Frequency: 1.0 time per week    Types: Cocaine, Marijuana  . Sexual activity: No   Other Topics Concern  . None    Social History Narrative  . None    Hospital Course:   [name] is a 35 year old male with a history of depression and polysubstance abuse admitted after suicide attempt by severely cutting his left forearm.   1. Suicidal ideation. The patient adamantly denies any thoughts, intentions, or plans to hurt himself or anybody else. He is able to contract for safety. He is forward thinking and optimistic about the future.   2. Major depressive disorder, recurrent: The patient has been started on Prozac for depression.   3. Anxiety. This was addressed with Hydroxyzine.   4. Insomnia. Trazodone was available.   5. Alcohol withdrawal. He completed Librium taper. This was uncomplicated detox. Vital signs were stable. The patient was advised to abstain from alcohol and also drugs as they may worsen mood symptoms.  6. Alcohol, cocaine, benzodiazepine use disorder. The patient declines residential  treatment. He will follow up with intensive outpatient substance abuse treatment program. He was advised to abstain from alcohol and all illicit drugs as they may worsen mood symptoms. He was offered Antabuse but refused.  7. GERD. We continue Pantaprozole.   8. Tobacco use disorder. Nicotine patch was available.  9. Self-inflicted laceration. OT has been working with the patient. Continue bacitracin twice a day and dressing changes daily. No signs of infection with dressing changes. Sutures are to be removed (walk in to our ER) on 8/10.  10. Disposition. The patient was discharged to a motel. He will move in with his twin brother tomorrow. He will follow-up with [facility] after discharge.   Physical Findings: AIMS: Facial and Oral Movements Muscles of Facial Expression: None, normal Lips and Perioral Area: None, normal Jaw: None, normal Tongue: None, normal,Extremity Movements Upper (arms, wrists, hands, fingers): None, normal Lower (legs, knees, ankles, toes): None, normal, Trunk  Movements Neck, shoulders, hips: None, normal, Overall Severity Severity of abnormal movements (highest score from questions above): None, normal Incapacitation due to abnormal movements: None, normal Patient's awareness of abnormal movements (rate only patient's report): No Awareness, Dental Status Current problems with teeth and/or dentures?: No Does patient usually wear dentures?: No  CIWA:  CIWA-Ar Total: 3 COWS:     Musculoskeletal: Strength & Muscle Tone: within normal limits Gait & Station: normal Patient leans: N/A  Psychiatric Specialty Exam: Physical Exam  Nursing note and vitals reviewed.   Review of Systems  Psychiatric/Behavioral: Positive for substance abuse. The patient is nervous/anxious.     Blood pressure 108/61, pulse 72, temperature 97.5 F (36.4 C), resp. rate 20, height 5\' 9"  (1.753 m), weight 60.3 kg (133 lb), SpO2 98 %.Body mass index is 19.64 kg/m.  See SRA.                                                  Sleep:  Number of Hours: 7.3     Have you used any form of tobacco in the last 30 days? (Cigarettes, Smokeless Tobacco, Cigars, and/or Pipes): Yes  Has this patient used any form of tobacco in the last 30 days? (Cigarettes, Smokeless Tobacco, Cigars, and/or Pipes) Yes, Yes, A prescription for an FDA-approved tobacco cessation medication was offered at discharge and the patient refused  Blood Alcohol level:  Lab Results  Component Value Date   ETH 257 (H) 05/22/2016   ETH (H) 01/31/2011    300        LOWEST DETECTABLE LIMIT FOR SERUM ALCOHOL IS 5 mg/dL FOR MEDICAL PURPOSES ONLY    Metabolic Disorder Labs:  No results found for: HGBA1C, MPG No results found for: PROLACTIN No results found for: CHOL, TRIG, HDL, CHOLHDL, VLDL, LDLCALC  See Psychiatric Specialty Exam and Suicide Risk Assessment completed by Attending Physician prior to discharge.  Discharge destination:  Home  Is patient on multiple antipsychotic  therapies at discharge:  No   Has Patient had three or more failed trials of antipsychotic monotherapy by history:  No  Recommended Plan for Multiple Antipsychotic Therapies: NA  Discharge Instructions    Diet - low sodium heart healthy    Complete by:  As directed   Increase activity slowly    Complete by:  As directed       Medication List    TAKE these medications  Indication  FLUoxetine 20 MG capsule Commonly known as:  PROZAC Take 1 capsule (20 mg total) by mouth daily.  Indication:  Major Depressive Disorder   hydrOXYzine 50 MG tablet Commonly known as:  ATARAX/VISTARIL Take 1 tablet (50 mg total) by mouth 3 (three) times daily as needed for anxiety.  Indication:  Anxiety Neurosis   naproxen 500 MG tablet Commonly known as:  NAPROSYN Take 1 tablet (500 mg total) by mouth 3 (three) times daily.  Indication:  Joint Damage causing Pain and Loss of Function   pantoprazole 40 MG tablet Commonly known as:  PROTONIX Take 1 tablet (40 mg total) by mouth 2 (two) times daily before a meal.  Indication:  Gastroesophageal Reflux Disease   traZODone 150 MG tablet Commonly known as:  DESYREL Take 1 tablet (150 mg total) by mouth at bedtime.  Indication:  Trouble Sleeping      Follow-up Information    [facility]. Go on 05/30/2016.   Why:  Please arrive to the walk-in clinic between the hours of 8am-2:30pm for an assessment for medication management and therapy.  Arrive as early as possible for prompt service.  Please call Unk Pinto at [phone] for questions and assistance. Contact information: [facility] [place] [address] [place] [address] Ph: [phone] Fax: [phone]          Follow-up recommendations:  Activity:  as tolerated. Diet:  regular. Other:  keep follow up appointments.  Comments:     Signed: Kristine Linea, MD 05/28/2016, 11:42 AM

## 2016-05-28 NOTE — [facility] Suicide Risk Assessment (Signed)
[facility]   Principal Problem: Major depressive disorder, recurrent severe without psychotic features [facility](HCC) Discharge Diagnoses:  Patient Active Problem List   Diagnosis Date Noted  . Alcohol use disorder, severe, dependence (HCC) [F10.20] 05/23/2016  . Cocaine use disorder, severe, dependence (HCC) [F14.20] 05/23/2016  . Tobacco use disorder [F17.200] 05/23/2016  . Self-inflicted injury [F48.9] 05/23/2016  . Major depressive disorder, recurrent severe without psychotic features (HCC) [F33.2] 05/23/2016  . Suicidal behavior [F48.9] 05/23/2016  . 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   All other systems reviewed and are negative.   Blood pressure 108/61, pulse 72, temperature 97.5 F (36.4 C), resp. rate 20, height 5\' 9"  (1.753 m), weight 60.3 kg (133 lb), SpO2 98 %.Body mass index is 19.64 kg/m.  General Appearance: Casual  Eye Contact::  Good  Speech:  Clear and Coherent409  Volume:  Normal  Mood:  Euthymic  Affect:  Appropriate  Thought Process:  Goal Directed  Orientation:  Full (Time, Place, and Person)  Thought Content:  WDL  Suicidal Thoughts:  No  Homicidal Thoughts:  No  Memory:  Immediate;   Fair Recent;   Fair Remote;   Fair  Judgement:  Impaired  Insight:  Present  Psychomotor Activity:  Normal  Concentration:  Fair  Recall:  FiservFair  Fund of Knowledge:Fair  Language: Fair  Akathisia:  No  Handed:  Right  AIMS (if indicated):     Assets:  Communication Skills Desire for Improvement Housing Physical Health Resilience Social Support  Sleep:  Number of Hours: 7.3  Cognition: WNL  ADL's:  Intact   Mental Status Per Nursing Assessment::   On  Admission:  NA  Demographic Factors:  Male and Caucasian  Loss Factors: Decline in physical health  Historical Factors: Impulsivity  Risk Reduction Factors:   Sense of responsibility to family, Employed, Living with another person, especially a relative and Positive social support  Continued Clinical Symptoms:  Depression:   Comorbid alcohol abuse/dependence Impulsivity Alcohol/Substance Abuse/Dependencies  Cognitive Features That Contribute To Risk:  None    Suicide Risk:  Minimal: No identifiable suicidal ideation.  Patients presenting with no risk factors but with morbid ruminations; may be classified as minimal risk based on the severity of the depressive symptoms  Follow-up Information    [facility]. Go on 05/30/2016.   Why:  Please arrive to the walk-in clinic between the hours of 8am-2:30pm for an assessment for medication management and therapy.  Arrive as early as possible for prompt service.  Please call [name] at [phone] for questions and assistance. Contact information: [facility] [place] [address] [place] [address] Ph: [phone] Fax: [phone]          Plan Of Care/Follow-up recommendations:  Activity:  as tolerated. Diet:  regular. Other:  keep follow up appointments.  Kristine LineaJolanta Lissy Deuser, MD 05/28/2016, 10:32 AM

## 2016-05-28 NOTE — Plan of Care (Signed)
Problem: [facility] Participation in Recreation Therapeutic Interventions Goal: STG-Patient will identify at least five coping skills for ** STG: Coping Skills - Within 4 treatment sessions, patient will verbalize at least 5 coping skills for substance abuse in each of 2 treatment sessions to decrease substance abuse post d/c.  Outcome: Completed/Met Date Met: 05/28/16 Treatment Session 2; Completed 2 out of 2: At approximately 11:35 am, LRT met with patient in patient room. Patient verbalized 6 coping skills for substance abuse. LRT encouraged patient to participate in leisure activities.  Leonette Monarch, LRT/CTRS 08.07.17 11:50 am Goal: STG-Other Recreation Therapy Goal (Specify) STG: Stress Management - Within 4 treatment sessions, patient will verbalize understanding of the stress management techniques in each of 2 treatment sessions to increase stress management skills post d/c.  Outcome: Completed/Met Date Met: 05/28/16 Treatment Session 2; Completed 2 out of 2: At approximately 11:35 am, LRT met with patient in patient room. Patient reported he read over and practiced the stress management techniques. Patient verbalized understanding and reported the techniques were helpful. LRT encouraged patient to continue practicing the stress management techniques.  Leonette Monarch, LRT/CTRS 08.07.17 11:52 am

## 2016-05-28 NOTE — Plan of Care (Signed)
Problem: Activity: Goal: Interest or engagement in leisure activities will improve Outcome: Progressing Pt visible in milieu socializing appropriately with peers.

## 2016-05-28 NOTE — Tx Team (Signed)
Interdisciplinary Treatment Plan Update (Adult)         Date: 05/28/2016   Time Reviewed: 10:30 AM   Progress in Treatment: Improving Attending groups: Yes  Participating in groups: Yes  Taking medication as prescribed: Yes  Tolerating medication: Yes  Family/Significant other contact made: Yes, CSW has spoken with mother Patient understands diagnosis: Yes  Discussing patient identified problems/goals with staff: Yes  Medical problems stabilized or resolved: Yes  Denies suicidal/homicidal ideation: Yes  Issues/concerns per patient self-inventory: Yes  Other:   New problem(s) identified: N/A   Discharge Plan or Barriers: see below   Reason for Continuation of Hospitalization:   Depression   Anxiety   Medication Stabilization   Comments: N/A   Estimated length of stay: 3-5 days    Patient is a 35 year old  male admitted for suicide attempt and suicidal ideation. Patient lives in [place], [place]. tells me he has been "flipping out" for about a month. He reports that he always had had issues with mental illness by his symptoms have been getting worse recently. Patient complains of having depressed mood and anxiety. He says that frequently he feels panicky and has severe anxiety that keeps him from eating. He says he sleeps about 4-5 hours at night, has been complaining of poor energy poor concentration, says that sometimes he just cannot remember simple things. He denies having any suicidal ideation prior to yesterday. He denies having any homicidal ideation. As far as auditory and visual hallucinations the patient stated that sometimes he sees things out of the corner of his eye. Patient will benefit from crisis stabilization, medication evaluation, group therapy, and psycho education in addition to case management for discharge planning. Patient and CSW reviewed pt's identified goals and treatment plan. Pt verbalized understanding and agreed to treatment plan.    Review of  initial/current patient goals per problem list:  1. Goal(s): Patient will participate in aftercare plan   Met: Yes  Target date: 3-5 days post admission date   As evidenced by: Patient will participate within aftercare plan AEB aftercare provider and housing plan at discharge being identified.   Patient will follow-up [place].  2. Goal (s): Patient will exhibit decreased depressive symptoms and suicidal ideations.   Met: Yes  Target date: 3-5 days post admission date   As evidenced by: Patient will utilize self-rating of depression at 3 or below and demonstrate decreased signs of depression or be deemed stable for discharge by MD.   05/25/16: Pt denies SI/HI.  Pt reports he is safe for discharge. Patient reports a depression score of 4 at this time.  05/28/16: Patient reports a depression score of 2. Adequate for discharge per MD.  3. Goal(s): Patient will demonstrate decreased signs and symptoms of anxiety.   Met: Yes  Target date: 3-5 days post admission date   As evidenced by: Patient will utilize self-rating of anxiety at 3 or below and demonstrated decreased signs of anxiety, or be deemed stable for discharge by MD   05/25/16: Pt. reports an anxiety of 5 at this time. 05/28/16: Patient reports an anxiety score of 3 at this time. Adequate for discharge per MD.   Attendees:  Patient: Fredricka Bonine Family:  Physician: Dr. Orson Slick, MD   05/28/2016 10:30 AM  Nursing: Elige Radon, RN     05/28/2016 10:30 AM  Clinical Social Worker: Glorious Peach, [place]  05/28/2016 10:30 AM

## 2016-05-28 NOTE — Progress Notes (Signed)
Recreation Therapy Notes  INPATIENT RECREATION TR PLAN  Patient Details Name: DONA KLEMANN MRN: [id number] DOB: 09-27-1981 Today's Date: 05/28/2016  Rec Therapy Plan Is patient appropriate for Therapeutic Recreation?: Yes Treatment times per week: At least once a week TR Treatment/Interventions: 1:1 session, Group participation (Comment) (Appropriate participation in daily recreational therapy tx)  Discharge Criteria Pt will be discharged from therapy if:: Treatment goals are met, Discharged Treatment plan/goals/alternatives discussed and agreed upon by:: Patient/family  Discharge Summary Short term goals set: See Care Plan Short term goals met: Complete Progress toward goals comments: One-to-one attended Which groups?: Coping skills, Leisure education One-to-one attended: Stress management, coping skills Reason goals not met: N/A Therapeutic equipment acquired: None Reason patient discharged from therapy: Discharge from hospital Pt/family agrees with progress & goals achieved: Yes Date patient discharged from therapy: 05/28/16   Leonette Monarch, LRT/CTRS 05/28/2016, 3:09 PM

## 2016-06-04 ENCOUNTER — Encounter: Payer: Self-pay | Admitting: Emergency Medicine

## 2016-06-04 ENCOUNTER — Emergency Department
Admission: EM | Admit: 2016-06-04 | Discharge: 2016-06-04 | Disposition: A | Discharge status: Home / Self Care | Payer: Self-pay | Attending: Emergency Medicine | Admitting: Emergency Medicine

## 2016-06-04 DIAGNOSIS — F17210 Nicotine dependence, cigarettes, uncomplicated: Secondary | ICD-10-CM | POA: Insufficient documentation

## 2016-06-04 DIAGNOSIS — F1290 Cannabis use, unspecified, uncomplicated: Secondary | ICD-10-CM | POA: Insufficient documentation

## 2016-06-04 DIAGNOSIS — F1490 Cocaine use, unspecified, uncomplicated: Secondary | ICD-10-CM | POA: Insufficient documentation

## 2016-06-04 DIAGNOSIS — Z4800 Encounter for change or removal of nonsurgical wound dressing: Secondary | ICD-10-CM | POA: Insufficient documentation

## 2016-06-04 DIAGNOSIS — Z5189 Encounter for other specified aftercare: Secondary | ICD-10-CM

## 2016-06-04 MED ORDER — CEPHALEXIN 500 MG PO CAPS: 500.0000 mg | ORAL_CAPSULE | Freq: Four times a day (QID) | ORAL | 0 refills | Status: AC

## 2016-06-04 NOTE — ED Triage Notes (Signed)
Patient presents to the ED to have sutures removed from left forearm.  Area appears reddened and has drainage.  Patient is in no obvious distress at this time.

## 2016-06-04 NOTE — Discharge Instructions (Signed)
Clean daily with mild soap and water. Begin taking antibiotics 4 times a day as directed until completely finished. Return to the emergency room in 4 days for wound recheck. Return sooner if any severe worsening of her symptoms such as draining pus or temperature over 101

## 2016-06-04 NOTE — ED Provider Notes (Signed)
[facility] Emergency Department Provider Note  ____________________________________________   First MD Initiated Contact with Patient 06/04/16 [phone]     (approximate)  I have reviewed the triage vital signs and the nursing notes.   HISTORY  Chief Complaint Suture / Staple Removal and Wound Check   HPI Christopher Murray is a 35 y.o. male is here for suture removal of his left forearm. Patient states that area has become red and there is been minimal drainage from the area. Patient feels like it has "heat in it".Patient is unaware of any fever or chills.   Past Medical History:  Diagnosis Date  . Scoliosis     Patient Active Problem List   Diagnosis Date Noted  . Alcohol use disorder, severe, dependence (HCC) 05/23/2016  . Cocaine use disorder, severe, dependence (HCC) 05/23/2016  . Tobacco use disorder 05/23/2016  . Self-inflicted injury 05/23/2016  . Major depressive disorder, recurrent severe without psychotic features (HCC) 05/23/2016  . Suicidal behavior 05/23/2016  . Sedative, hypnotic or anxiolytic abuse, episodic 05/23/2016    History reviewed. No pertinent surgical history.  Prior to Admission medications   Medication Sig Start Date End Date Taking? Authorizing Provider  buPROPion (WELLBUTRIN XL) 150 MG 24 hr tablet Take 150 mg by mouth daily.   Yes Historical Provider, MD  cephALEXin (KEFLEX) 500 MG capsule Take 1 capsule (500 mg total) by mouth 4 (four) times daily. 06/04/16   Tommi Rumpshonda L Covey Baller, PA-C  FLUoxetine (PROZAC) 20 MG capsule Take 1 capsule (20 mg total) by mouth daily. 05/28/16   Shari ProwsJolanta B Pucilowska, MD  pantoprazole (PROTONIX) 40 MG tablet Take 1 tablet (40 mg total) by mouth 2 (two) times daily before a meal. 05/28/16   Jolanta B Pucilowska, MD  traZODone (DESYREL) 150 MG tablet Take 1 tablet (150 mg total) by mouth at bedtime. 05/28/16   Shari ProwsJolanta B Pucilowska, MD    Allergies Wellbutrin [bupropion hcl] and Banana  No family  history on file.  Social History Social History  Substance Use Topics  . Smoking status: Current Every Day Smoker    Packs/day: 1.00    Types: Cigarettes  . Smokeless tobacco: Never Used  . Alcohol use 3.6 oz/week    6 Cans of beer per week    Review of Systems Constitutional: No fever/chills strointestinal:   No nausea, no vomiting.  Musculoskeletal: Negative for back pain. Skin: Suture site with erythema positive. Neurological: Negative for headaches, focal weakness or numbness.  10-point ROS otherwise negative.  ____________________________________________   PHYSICAL EXAM:  VITAL SIGNS: ED Triage Vitals  Enc Vitals Group     BP 06/04/16 0848 128/85     Pulse Rate 06/04/16 0848 75     Resp 06/04/16 0848 20     Temp 06/04/16 0848 98.2 F (36.8 C)     Temp Source 06/04/16 0848 Oral     SpO2 06/04/16 0848 98 %     Weight 06/04/16 0846 145 lb (65.8 kg)     Height 06/04/16 0846 5\' 9"  (1.753 m)     Head Circumference --      Peak Flow --      Pain Score 06/04/16 0849 6     Pain Loc --      Pain Edu? --      Excl. in GC? --     Constitutional: Alert and oriented. Well appearing and in no acute distress. Eyes: Conjunctivae are normal. PERRL. EOMI. Head: Atraumatic. Nose: No congestion/rhinnorhea. Neck: No stridor.  Cardiovascular: Normal rate, regular rhythm. Grossly normal heart sounds.  Good peripheral circulation. Respiratory: Normal respiratory effort.  No retractions. Lungs CTAB. Musculoskeletal: Moves upper and lower extremities without difficulty. Normal gait was noted. Patient is able to use his left forearm but guards against anything touching his wound site. Neurologic:  Normal speech and language. No gross focal neurologic deficits are appreciated. No gait instability. Skin:  On examination of the suture site of the left forearm there is erythema and tenderness. There is minimal purulent drainage from the area at this time. There is some areas that are  dehiscent. Psychiatric: Mood and affect are normal. Speech and behavior are normal.  ____________________________________________   LABS (all labs ordered are listed, but only abnormal results are displayed)  Labs Reviewed - No data to display  PROCEDURES  Procedure(s) performed: None  Procedures  Critical Care performed: No  ____________________________________________   INITIAL IMPRESSION / ASSESSMENT AND PLAN / ED COURSE  Pertinent labs & imaging results that were available during my care of the patient were reviewed by me and considered in my medical decision making (see chart for details).    Clinical Course   Patient was started on Keflex 500 mg 4 times a day and to return to the emergency room in 3-4 days for suture removal. At that time it might be advisable to remove and use Steri-Strips in its place to allow some drainage of needed. Patient will return if any severe worsening of his symptoms.  ____________________________________________   FINAL CLINICAL IMPRESSION(S) / ED DIAGNOSES  Final diagnoses:  Encounter for wound re-check      NEW MEDICATIONS STARTED DURING THIS VISIT:  Discharge Medication List as of 06/04/2016  8:57 AM    START taking these medications   Details  cephALEXin (KEFLEX) 500 MG capsule Take 1 capsule (500 mg total) by mouth 4 (four) times daily., Starting Mon 06/04/2016, Print         Note:  This document was prepared using Dragon voice recognition software and may include unintentional dictation errors.    Tommi RumpsRhonda L Dagen Beevers, PA-C 06/04/16 1609    Myrna Blazeravid Matthew Schaevitz, MD 06/04/16 [phone]

## 2016-06-04 NOTE — ED Notes (Signed)
See triage note   States he is here for wound check and possible suture removal  Developed some drainage around wound

## 2016-06-10 ENCOUNTER — Encounter: Payer: Self-pay | Admitting: Emergency Medicine

## 2016-06-10 ENCOUNTER — Emergency Department
Admission: EM | Admit: 2016-06-10 | Discharge: 2016-06-12 | Disposition: A | Discharge status: Home / Self Care | Payer: Self-pay | Attending: Emergency Medicine | Admitting: Emergency Medicine

## 2016-06-10 DIAGNOSIS — Z79899 Other long term (current) drug therapy: Secondary | ICD-10-CM | POA: Insufficient documentation

## 2016-06-10 DIAGNOSIS — F1310 Sedative, hypnotic or anxiolytic abuse, uncomplicated: Secondary | ICD-10-CM | POA: Diagnosis present

## 2016-06-10 DIAGNOSIS — F333 Major depressive disorder, recurrent, severe with psychotic symptoms: Secondary | ICD-10-CM

## 2016-06-10 DIAGNOSIS — F17210 Nicotine dependence, cigarettes, uncomplicated: Secondary | ICD-10-CM | POA: Insufficient documentation

## 2016-06-10 DIAGNOSIS — F1020 Alcohol dependence, uncomplicated: Secondary | ICD-10-CM | POA: Diagnosis present

## 2016-06-10 DIAGNOSIS — F323 Major depressive disorder, single episode, severe with psychotic features: Secondary | ICD-10-CM | POA: Insufficient documentation

## 2016-06-10 DIAGNOSIS — F332 Major depressive disorder, recurrent severe without psychotic features: Secondary | ICD-10-CM | POA: Diagnosis present

## 2016-06-10 HISTORY — DX: Depression, unspecified: F32.A

## 2016-06-10 HISTORY — DX: Major depressive disorder, single episode, unspecified: F32.9

## 2016-06-10 LAB — URINE DRUG SCREEN, QUALITATIVE (ARMC ONLY)
AMPHETAMINES, UR SCREEN: NOT DETECTED
Barbiturates, Ur Screen: NOT DETECTED
Benzodiazepine, Ur Scrn: POSITIVE — AB
CANNABINOID 50 NG, UR ~~LOC~~: POSITIVE — AB
COCAINE METABOLITE, UR ~~LOC~~: NOT DETECTED
MDMA (ECSTASY) UR SCREEN: NOT DETECTED
METHADONE SCREEN, URINE: NOT DETECTED
Opiate, Ur Screen: NOT DETECTED
Phencyclidine (PCP) Ur S: NOT DETECTED
TRICYCLIC, UR SCREEN: NOT DETECTED

## 2016-06-10 LAB — COMPREHENSIVE METABOLIC PANEL
ALT: 21 U/L (ref 17–63)
ANION GAP: 8 (ref 5–15)
AST: 33 U/L (ref 15–41)
Albumin: 4.4 g/dL (ref 3.5–5.0)
Alkaline Phosphatase: 59 U/L (ref 38–126)
BILIRUBIN TOTAL: 1 mg/dL (ref 0.3–1.2)
BUN: 17 mg/dL (ref 6–20)
CHLORIDE: 102 mmol/L (ref 101–111)
CO2: 27 mmol/L (ref 22–32)
Calcium: 9.1 mg/dL (ref 8.9–10.3)
Creatinine, Ser: 1.02 mg/dL (ref 0.61–1.24)
Glucose, Bld: 99 mg/dL (ref 65–99)
POTASSIUM: 4.3 mmol/L (ref 3.5–5.1)
Sodium: 137 mmol/L (ref 135–145)
TOTAL PROTEIN: 7.2 g/dL (ref 6.5–8.1)

## 2016-06-10 LAB — SALICYLATE LEVEL

## 2016-06-10 LAB — CBC
HCT: 37.9 % — ABNORMAL LOW (ref 40.0–52.0)
HEMOGLOBIN: 13.5 g/dL (ref 13.0–18.0)
MCH: 36.8 pg — AB (ref 26.0–34.0)
MCHC: 35.5 g/dL (ref 32.0–36.0)
MCV: 103.4 fL — ABNORMAL HIGH (ref 80.0–100.0)
PLATELETS: 295 10*3/uL (ref 150–440)
RBC: 3.67 MIL/uL — ABNORMAL LOW (ref 4.40–5.90)
RDW: 14.5 % (ref 11.5–14.5)
WBC: 6.1 10*3/uL (ref 3.8–10.6)

## 2016-06-10 LAB — ACETAMINOPHEN LEVEL

## 2016-06-10 MED ORDER — LORAZEPAM 1 MG PO TABS
1.0000 mg | ORAL_TABLET | Freq: Three times a day (TID) | ORAL | Status: DC | PRN
  Administered 2016-06-10: 1 mg via ORAL
  Filled 2016-06-10: qty 1

## 2016-06-10 MED ORDER — TRAZODONE HCL 50 MG PO TABS
150.0000 mg | ORAL_TABLET | Freq: Every day | ORAL | Status: DC
  Administered 2016-06-10 – 2016-06-11 (×2): 150 mg via ORAL
  Filled 2016-06-10 (×2): qty 1

## 2016-06-10 MED ORDER — FLUOXETINE HCL 20 MG PO CAPS
20.0000 mg | ORAL_CAPSULE | Freq: Every day | ORAL | Status: DC
  Administered 2016-06-11 – 2016-06-12 (×2): 20 mg via ORAL
  Filled 2016-06-10 (×2): qty 1

## 2016-06-10 MED ORDER — FLUOXETINE HCL 20 MG PO CAPS
ORAL_CAPSULE | ORAL | Status: AC
  Administered 2016-06-10: 20 mg
  Filled 2016-06-10: qty 1

## 2016-06-10 MED ORDER — PANTOPRAZOLE SODIUM 40 MG PO TBEC
40.0000 mg | DELAYED_RELEASE_TABLET | Freq: Two times a day (BID) | ORAL | Status: DC
  Administered 2016-06-11 – 2016-06-12 (×3): 40 mg via ORAL
  Filled 2016-06-10 (×3): qty 1

## 2016-06-10 MED ORDER — CEPHALEXIN 500 MG PO CAPS
500.0000 mg | ORAL_CAPSULE | Freq: Four times a day (QID) | ORAL | Status: DC
  Administered 2016-06-10 – 2016-06-11 (×2): 500 mg via ORAL

## 2016-06-10 MED ORDER — CEPHALEXIN 500 MG PO CAPS
500.0000 mg | ORAL_CAPSULE | Freq: Four times a day (QID) | ORAL | Status: DC
  Administered 2016-06-10 – 2016-06-12 (×7): 500 mg via ORAL
  Filled 2016-06-10 (×9): qty 1

## 2016-06-10 MED ORDER — HYDROXYZINE HCL 25 MG PO TABS
50.0000 mg | ORAL_TABLET | Freq: Three times a day (TID) | ORAL | Status: DC | PRN
  Administered 2016-06-10 – 2016-06-12 (×3): 50 mg via ORAL
  Filled 2016-06-10 (×4): qty 2

## 2016-06-10 MED ORDER — NAPROXEN 500 MG PO TABS
500.0000 mg | ORAL_TABLET | Freq: Three times a day (TID) | ORAL | Status: DC
  Administered 2016-06-11 – 2016-06-12 (×4): 500 mg via ORAL
  Filled 2016-06-10 (×4): qty 1
  Filled 2016-06-10: qty 2
  Filled 2016-06-10 (×5): qty 1

## 2016-06-10 NOTE — ED Notes (Signed)
Pt eating meal tray 

## 2016-06-10 NOTE — ED Notes (Signed)
Report was received Christopher Murray.RN; Pt. Verbalizes no complaints or distress; verbalizes having; with a self inflicted laceration on left forearm. S.I.; denies having; denies having Hi. Continue to monitor with 15 min. Monitoring.

## 2016-06-10 NOTE — ED Notes (Signed)
Pt given meal tray and offered drink.

## 2016-06-10 NOTE — ED Notes (Signed)
ED BHU PLACEMENT JUSTIFICATION Is the patient under IVC or is there intent for IVC: Yes.   Is the patient medically cleared: Yes.   Is there vacancy in the ED BHU: Yes.   Is the population mix appropriate for patient: Yes.   Is the patient awaiting placement in inpatient or outpatient setting: Yes.   Has the patient had a psychiatric consult: Yes.   Survey of unit performed for contraband, proper placement and condition of furniture, tampering with fixtures in bathroom, shower, and each patient room: Yes.   APPEARANCE/BEHAVIOR calm, cooperative and adequate rapport can be established NEURO ASSESSMENT Orientation: time, place and person Hallucinations: No.None noted (Hallucinations) Speech: Normal Gait: normal RESPIRATORY ASSESSMENT Normal expansion.  Clear to auscultation.  No rales, rhonchi, or wheezing. CARDIOVASCULAR ASSESSMENT regular rate and rhythm, S1, S2 normal, no murmur, click, rub or gallop GASTROINTESTINAL ASSESSMENT soft, nontender, BS WNL, no r/g EXTREMITIES normal strength, tone, and muscle mass PLAN OF CARE Provide calm/safe environment. Vital signs assessed twice daily. ED BHU Assessment once each 12-hour shift. Collaborate with intake RN daily or as condition indicates. Assure the ED provider has rounded once each shift. Provide and encourage hygiene. Provide redirection as needed. Assess for escalating behavior; address immediately and inform ED provider.  Assess family dynamic and appropriateness for visitation as needed: Yes.  ; If necessary, describe findings:  Educate the patient/family about BHU procedures/visitation: Yes.

## 2016-06-10 NOTE — ED Notes (Addendum)
Sutures removed per verbal order from Quale, MD. Pt tolerated well. Removed 13 stitches from left inner forearm

## 2016-06-10 NOTE — ED Notes (Signed)
Pt being changed out by [employer]

## 2016-06-10 NOTE — ED Provider Notes (Signed)
[facility] Emergency Department Provider Note   ____________________________________________   First MD Initiated Contact with Patient 06/10/16 1648     (approximate)  I have reviewed the triage vital signs and the nursing notes.   HISTORY  Chief Complaint Tremors    HPI Christopher Murray is a 35 y.o. male history of major depression, previous alcohol and polysubstance abuse. Recent admission for self-injurious behavior.  Patient presents today states that since Thursday he's had several "panic attacks" he feels very anxious and tremulous. He's had more anxiety than normal last few days, and is noticed that times both hands are shaking. He is abstaining from alcohol for the last couple of weeks except drank only on one day. Denies that he feels like he has "the shakes" from withdrawal.  He does endorse having passive suicidal thoughts over the last few days. In addition he states he is on antibiotic for his left arm, and the redness has improved and he is not having any pain but feels sutures likely need to come out.   Past Medical History:  Diagnosis Date  . Depression   . Scoliosis     Patient Active Problem List   Diagnosis Date Noted  . Alcohol use disorder, severe, dependence (HCC) 05/23/2016  . Cocaine use disorder, severe, dependence (HCC) 05/23/2016  . Tobacco use disorder 05/23/2016  . Self-inflicted injury 05/23/2016  . Major depressive disorder, recurrent severe without psychotic features (HCC) 05/23/2016  . Suicidal behavior 05/23/2016  . Sedative, hypnotic or anxiolytic abuse, episodic 05/23/2016    History reviewed. No pertinent surgical history.  Prior to Admission medications   Medication Sig Start Date End Date Taking? Authorizing Provider  cephALEXin (KEFLEX) 500 MG capsule Take 1 capsule (500 mg total) by mouth 4 (four) times daily. 06/04/16  Yes Tommi Rumpshonda L Summers, PA-C  FLUoxetine (PROZAC) 20 MG capsule Take 1 capsule (20 mg  total) by mouth daily. 05/28/16  Yes Jolanta B Pucilowska, MD  hydrOXYzine (VISTARIL) 50 MG capsule Take 50 mg by mouth 3 (three) times daily as needed for anxiety.   Yes Historical Provider, MD  naproxen (NAPROSYN) 500 MG tablet Take 500 mg by mouth 3 (three) times daily with meals.   Yes Historical Provider, MD  pantoprazole (PROTONIX) 40 MG tablet Take 1 tablet (40 mg total) by mouth 2 (two) times daily before a meal. 05/28/16  Yes Jolanta B Pucilowska, MD  traZODone (DESYREL) 100 MG tablet Take 150 mg by mouth at bedtime.   Yes Historical Provider, MD  traZODone (DESYREL) 150 MG tablet Take 1 tablet (150 mg total) by mouth at bedtime. 05/28/16  Yes Shari ProwsJolanta B Pucilowska, MD    Allergies Wellbutrin [bupropion hcl] and Banana  History reviewed. No pertinent family history.  Social History Social History  Substance Use Topics  . Smoking status: Current Every Day Smoker    Packs/day: 1.00    Types: Cigarettes  . Smokeless tobacco: Never Used  . Alcohol use 3.6 oz/week    6 Cans of beer per week    Review of Systems Constitutional: No fever/chills Eyes: No visual changes. ENT: No sore throat. Cardiovascular: Denies chest pain. Respiratory: Denies shortness of breath. Gastrointestinal: No abdominal pain.  No nausea, no vomiting.  No diarrhea.  No constipation. Genitourinary: Negative for dysuria. Musculoskeletal: Negative for back pain. Skin: Negative for rashExcept healing sutures in the left forearm. Neurological: Negative for headaches, focal weakness or numbness. Reports feeling anxious and slightly tremulous  Passive suicidal thoughts.  10-point  ROS otherwise negative.  ____________________________________________   PHYSICAL EXAM:  VITAL SIGNS: ED Triage Vitals  Enc Vitals Group     BP 06/10/16 1543 (!) 142/67     Pulse Rate 06/10/16 1543 94     Resp 06/10/16 1543 20     Temp 06/10/16 1543 97.7 F (36.5 C)     Temp src --      SpO2 06/10/16 1543 97 %     Weight  06/10/16 1543 145 lb (65.8 kg)     Height 06/10/16 1543 5\' 9"  (1.753 m)     Head Circumference --      Peak Flow --      Pain Score 06/10/16 1544 0     Pain Loc --      Pain Edu? --      Excl. in GC? --     Constitutional: Alert and oriented. Well appearing and in no acute distress. Eyes: Conjunctivae are normal. PERRL. EOMI. Head: Atraumatic. Nose: No congestion/rhinnorhea. Mouth/Throat: Mucous membranes are moist.  Oropharynx non-erythematous. Neck: No stridor.   Cardiovascular: Normal rate, regular rhythm. Grossly normal heart sounds.  Good peripheral circulation. Respiratory: Normal respiratory effort.  No retractions. Lungs CTAB. Gastrointestinal: Soft and nontender. No distention. No abdominal bruits. No CVA tenderness. Musculoskeletal: No lower extremity tenderness nor edema.  No joint effusions. Left upper forearm with healing laceration, well approximated with 13 visible sutures. Sutures removed without difficulty, examined closely no sign of retained suturing material, however laceration note for original repair does not state the original number of sutures. Neurologic:  Normal speech and language. No gross focal neurologic deficits are appreciated. Normal reflexes bilateral. No nystagmus. Stable gait. No extremity tremor noted at this time, patient reports it comes and goes. Skin:  Skin is warm, dry and intact. No rash noted. Psychiatric: Mood and affect are slightly flat, slightly anxious. ____________________________________________   LABS (all labs ordered are listed, but only abnormal results are displayed)  Labs Reviewed  CBC - Abnormal; Notable for the following:       Result Value   RBC 3.67 (*)    HCT 37.9 (*)    MCV 103.4 (*)    MCH 36.8 (*)    All other components within normal limits  ACETAMINOPHEN LEVEL - Abnormal; Notable for the following:    Acetaminophen (Tylenol), Serum <10 (*)    All other components within normal limits  URINE DRUG SCREEN,  QUALITATIVE ([facility] ONLY) - Abnormal; Notable for the following:    Cannabinoid 50 Ng, Ur [place] POSITIVE (*)    Benzodiazepine, Ur Scrn POSITIVE (*)    All other components within normal limits  SALICYLATE LEVEL  COMPREHENSIVE METABOLIC PANEL   ____________________________________________  EKG   ____________________________________________  RADIOLOGY   ____________________________________________   PROCEDURES  Procedure(s) performed: None  Procedures  Critical Care performed: No  ____________________________________________   INITIAL IMPRESSION / ASSESSMENT AND PLAN / ED COURSE  Pertinent labs & imaging results that were available during my care of the patient were reviewed by me and considered in my medical decision making (see chart for details).  Appears left forearm wound is healing well. Patient stable, no evidence of acute tremor or neurologic abnormality at this time. We'll give him slight dose of Ativan for his anxiety, placed him under commitment due to ongoing thoughts of suicide with an obvious attempt just a few weeks ago.  ----------------------------------------- 6:57 PM on 06/10/2016 -----------------------------------------  Patient medically cleared for evaluation by psychiatry. Patient to go to the  BHU. No emergent medical condition identified on screening exam. Psych consult pending.  Clinical Course     ____________________________________________   FINAL CLINICAL IMPRESSION(S) / ED DIAGNOSES  Final diagnoses:  Severe episode of recurrent major depressive disorder, with psychotic features (HCC)      NEW MEDICATIONS STARTED DURING THIS VISIT:  New Prescriptions   No medications on file     Note:  This document was prepared using Dragon voice recognition software and may include unintentional dictation errors.     Sharyn CreamerMark Quale, MD 06/10/16 [phone]

## 2016-06-10 NOTE — ED Notes (Addendum)
Pt states since thurs he has had tremors, feeling anxious and started vomiting blood. Pt states he has been taking the medication prescribed but its not working. Pt also states he recently attempted suicide and is now having suicidal thoughts. Pt states he feels like he felt the night he attempted suicide.

## 2016-06-10 NOTE — ED Triage Notes (Signed)
Pt states was discharged from beh med approx 10 days ago and was told if he had any problems to come back into the ED. States his hands have been trembling x 3 days. Has been taking his psych meds as prescribed - prozac, trazadone, hydroxyzine, and pantoprazole

## 2016-06-10 NOTE — ED Notes (Signed)
Pt resting comfortably at this time.  Pt calm, cooperative and pleasant.  Pt is A&Ox4.

## 2016-06-10 NOTE — ED Notes (Signed)
Report to Iris, RN  

## 2016-06-10 NOTE — ED Notes (Signed)
States feels his tremors is d/t needing a medication adjustment. Has an appt at the end of the month with new psych unknown name. States he is afraid his depression is coming back and that he might start feeling like hurting himself.

## 2016-06-11 MED ORDER — NICOTINE 21 MG/24HR TD PT24
21.0000 mg | MEDICATED_PATCH | Freq: Every day | TRANSDERMAL | Status: DC
Start: 2016-06-11 — End: 2016-06-12
  Administered 2016-06-11 – 2016-06-12 (×2): 21 mg via TRANSDERMAL
  Filled 2016-06-11 (×2): qty 1

## 2016-06-11 NOTE — ED Notes (Signed)
Patient is alert and oriented, takes morning medications without difficulty, states that He does not know how He feels, but really not much different from when He came in, states that he does not have a plan, but does not care if He dies" He is cooperative, but states that He want to go back to sleep. Nurse encouraged him to call if He needed anything or has any concerns.

## 2016-06-11 NOTE — ED Notes (Signed)
Patient is sleeping, no signs of distress, Patient is safe, q 15 min. Checks and camera monitoring in progress.

## 2016-06-11 NOTE — ED Notes (Signed)
Patient is eating dinner, no signs of distress. q 15 min. Checks.

## 2016-06-11 NOTE — ED Notes (Signed)
Patient is sleeping, no signs of distress noted. Patient with q 15 min. Checks and camera monitoring in progress.

## 2016-06-11 NOTE — ED Provider Notes (Signed)
-----------------------------------------   6:38 AM on 06/11/2016 -----------------------------------------   Blood pressure 113/68, pulse 78, temperature 97.7 F (36.5 C), temperature source Oral, resp. rate 18, height 5\' 9"  (1.753 m), weight 145 lb (65.8 kg), SpO2 97 %.  The patient had no acute events since last update.  Calm and cooperative at this time.  Disposition is pending per Psychiatry/Behavioral Medicine team recommendations.     Rebecka ApleyAllison P Lilinoe Acklin, MD 06/11/16 [phone]

## 2016-06-11 NOTE — ED Notes (Signed)
Patient is alert and oriented, states that He has a lot of stress , struggles with depression, Patient's father is supportive, he states that he does drink at times, patient denies Si/hi or avh at this time, states that He knows if He leaves that the thoughts of killing himself will come back and that He wants to go downstairs to improve and that it did help him before, He wants long term treatment he states, Patient with q 15 min. Checks and camera monitoring.

## 2016-06-12 DIAGNOSIS — F332 Major depressive disorder, recurrent severe without psychotic features: Secondary | ICD-10-CM

## 2016-06-12 NOTE — ED Notes (Signed)
Pt is aware he is dc he is waiting for a ride and a place to go , I did speak with his friend who said he would call back after 5 pm with an answer

## 2016-06-12 NOTE — ED Notes (Signed)
Pt was unable to find support to help him with housing he says he will find a friend to stay tonight and go to [facility] in the morning to see if they can help him find a place to stay ,he is in no distress and has no c/o and wants to go

## 2016-06-12 NOTE — ED Provider Notes (Signed)
Patient's been cleared by psychiatry for discharge.   Christopher FilbertJonathan E Siegfried Vieth, MD 06/12/16 [phone]

## 2016-06-12 NOTE — ED Provider Notes (Signed)
Vitals:   06/11/16 2040 06/12/16 0630  BP: 113/71 (!) 89/66  Pulse: 76 69  Resp: 18 18  Temp: 98.1 F (36.7 C) 98 F (36.7 C)   Labs Reviewed  CBC - Abnormal; Notable for the following:       Result Value   RBC 3.67 (*)    HCT 37.9 (*)    MCV 103.4 (*)    MCH 36.8 (*)    All other components within normal limits  ACETAMINOPHEN LEVEL - Abnormal; Notable for the following:    Acetaminophen (Tylenol), Serum <10 (*)    All other components within normal limits  URINE DRUG SCREEN, QUALITATIVE ([facility] ONLY) - Abnormal; Notable for the following:    Cannabinoid 50 Ng, Ur [place] POSITIVE (*)    Benzodiazepine, Ur Scrn POSITIVE (*)    All other components within normal limits  SALICYLATE LEVEL  COMPREHENSIVE METABOLIC PANEL   Patient remains medically stable for psychiatric disposition.   Emily FilbertJonathan E Williams, MD 06/12/16 [phone]

## 2016-06-12 NOTE — Consult Note (Signed)
[facility] [place] Consult   Reason for Consult:  Consult for 35 year old man with a history of depression and anxiety and substance abuse Referring Physician:  Quale Patient Identification: SEWARD CORAN MRN:  [id number] Principal Diagnosis: Major depressive disorder, recurrent severe without psychotic features [facility]) Diagnosis:   Patient Active Problem List   Diagnosis Date Noted  . Alcohol use disorder, severe, dependence ([place]) [F10.20] 05/23/2016  . Cocaine use disorder, severe, dependence ([place]) [F14.20] 05/23/2016  . Tobacco use disorder [F17.200] 05/23/2016  . Self-inflicted injury [N46.2] 05/23/2016  . Major depressive disorder, recurrent severe without psychotic features ([place]) [F33.2] 05/23/2016  . Suicidal behavior [F48.9] 05/23/2016  . 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 male patient admitted with "I was feeling a little shaky but now I'm better".  HPI:  Patient interviewed. Chart reviewed. Labs reviewed. 35 year old man came to the emergency room saying that he was feeling shaky all over and having vague thoughts of suicide although he had no intent of acting on it. His mood is been feeling more depressed. All of this had just been going on for a couple of days. He admits that he used a little bit of alcohol last week but claims he only been drinking 2 times since the last time he was here in the hospital and has used marijuana once. He denies that he's having any psychosis or hallucinations. He now says he is feeling much better and denies suicidal ideation. Still feels like he has a bit of a tremor and chronic anxiety but feels more optimistic about taking care of himself. In the time since he was [facility] but [facility]'t [facility] all of his medicines. He now thinks that he may have found a better place to stay with some  friends.  Medical history: Gastric reflux. No other significant ongoing medical problems.  Social history: Currently living by himself. Somewhat estranged from most of his family. Says that he's can go live with some friends who may have a set up where he can get back to work soon.  Substance abuse history: Long history of alcohol abuse. No DTs. No seizures. Also abuse of prescription sedative medicines and marijuana. Supposedly now starting to engage in [place] treatment.  Past Psychiatric History: Previous hospitalization here just a month ago with suicidal ideation. Now he is on medicine and says he is being compliant with it in going to [place].  Risk to Self: Is patient at risk for suicide?: Yes Risk to Others:   Prior Inpatient Therapy:   Prior Outpatient Therapy:    Past Medical History:  Past Medical History:  Diagnosis Date  . Depression   . Scoliosis    History reviewed. No pertinent surgical history. Family History: History reviewed. No pertinent family history. Family Psychiatric  History: Positive for a father and cousin with bipolar disorder cousin committed suicide. Multiple people in the family with alcohol problems Social History:  History  Alcohol Use  . 3.6 oz/week  . 6 Cans of beer per week     History  Drug Use  . Frequency: 1.0 time per week  . Types: Cocaine, Marijuana    Social History   Social History  . Marital status: Single    Spouse name: N/A  . Number of children: N/A  . Years of education: N/A   Social History  Main Topics  . Smoking status: Current Every Day Smoker    Packs/day: 1.00    Types: Cigarettes  . Smokeless tobacco: Never Used  . Alcohol use 3.6 oz/week    6 Cans of beer per week  . Drug use:     Frequency: 1.0 time per week    Types: Cocaine, Marijuana  . Sexual activity: No   Other Topics Concern  . None   Social History Narrative  . None   Additional Social History:    Allergies:   Allergies  Allergen Reactions  .  Wellbutrin [Bupropion Hcl] Hives  . Banana     Food allergy    Labs:  Results for orders placed or performed during the hospital encounter of 06/10/16 (from the past 48 hour(s))  CBC     Status: Abnormal   Collection Time: 06/10/16  6:02 PM  Result Value Ref Range   WBC 6.1 3.8 - 10.6 K/uL   RBC 3.67 (L) 4.40 - 5.90 MIL/uL   Hemoglobin 13.5 13.0 - 18.0 g/dL   HCT 37.9 (L) 40.0 - 52.0 %   MCV 103.4 (H) 80.0 - 100.0 fL   MCH 36.8 (H) 26.0 - 34.0 pg   MCHC 35.5 32.0 - 36.0 g/dL   RDW 14.5 11.5 - 14.5 %   Platelets 295 150 - 440 K/uL  Acetaminophen level     Status: Abnormal   Collection Time: 06/10/16  6:02 PM  Result Value Ref Range   Acetaminophen (Tylenol), Serum <10 (L) 10 - 30 ug/mL    Comment:        THERAPEUTIC CONCENTRATIONS VARY SIGNIFICANTLY. A RANGE OF 10-30 ug/mL MAY BE AN EFFECTIVE CONCENTRATION FOR MANY PATIENTS. HOWEVER, SOME ARE BEST TREATED AT CONCENTRATIONS OUTSIDE THIS RANGE. ACETAMINOPHEN CONCENTRATIONS >150 ug/mL AT 4 HOURS AFTER INGESTION AND >50 ug/mL AT 12 HOURS AFTER INGESTION ARE OFTEN ASSOCIATED WITH TOXIC REACTIONS.   Salicylate level     Status: None   Collection Time: 06/10/16  6:02 PM  Result Value Ref Range   Salicylate Lvl <1.6 2.8 - 30.0 mg/dL  Comprehensive metabolic panel     Status: None   Collection Time: 06/10/16  6:02 PM  Result Value Ref Range   Sodium 137 135 - 145 mmol/L   Potassium 4.3 3.5 - 5.1 mmol/L   Chloride 102 101 - 111 mmol/L   CO2 27 22 - 32 mmol/L   Glucose, Bld 99 65 - 99 mg/dL   BUN 17 6 - 20 mg/dL   Creatinine, Ser 1.02 0.61 - 1.24 mg/dL   Calcium 9.1 8.9 - 10.3 mg/dL   Total Protein 7.2 6.5 - 8.1 g/dL   Albumin 4.4 3.5 - 5.0 g/dL   AST 33 15 - 41 U/L   ALT 21 17 - 63 U/L   Alkaline Phosphatase 59 38 - 126 U/L   Total Bilirubin 1.0 0.3 - 1.2 mg/dL   GFR calc non Af Amer >60 >60 mL/min   GFR calc Af Amer >60 >60 mL/min    Comment: (NOTE) The eGFR has been calculated using the CKD EPI equation. This  calculation has not been validated in all clinical situations. eGFR's persistently <60 mL/min signify possible Chronic Kidney Disease.    Anion gap 8 5 - 15  Urine Drug Screen, Qualitative ([facility] only)     Status: Abnormal   Collection Time: 06/10/16  6:03 PM  Result Value Ref Range   Tricyclic, Ur Screen NONE DETECTED NONE DETECTED   Amphetamines, Ur Screen  NONE DETECTED NONE DETECTED   MDMA (Ecstasy)Ur Screen NONE DETECTED NONE DETECTED   Cocaine Metabolite,Ur [place] NONE DETECTED NONE DETECTED   Opiate, Ur Screen NONE DETECTED NONE DETECTED   Phencyclidine (PCP) Ur S NONE DETECTED NONE DETECTED   Cannabinoid 50 Ng, Ur [place] POSITIVE (A) NONE DETECTED   Barbiturates, Ur Screen NONE DETECTED NONE DETECTED   Benzodiazepine, Ur Scrn POSITIVE (A) NONE DETECTED   Methadone Scn, Ur NONE DETECTED NONE DETECTED    Comment: (NOTE) 371  Tricyclics, urine               Cutoff 1000 ng/mL 200  Amphetamines, urine             Cutoff 1000 ng/mL 300  MDMA (Ecstasy), urine           Cutoff 500 ng/mL 400  Cocaine Metabolite, urine       Cutoff 300 ng/mL 500  Opiate, urine                   Cutoff 300 ng/mL 600  Phencyclidine (PCP), urine      Cutoff 25 ng/mL 700  Cannabinoid, urine              Cutoff 50 ng/mL 800  Barbiturates, urine             Cutoff 200 ng/mL 900  Benzodiazepine, urine           Cutoff 200 ng/mL 1000 Methadone, urine                Cutoff 300 ng/mL 1100 1200 The urine drug screen provides only a preliminary, unconfirmed 1300 analytical test result and should not be used for non-medical 1400 purposes. Clinical consideration and professional judgment should 1500 be applied to any positive drug screen result due to possible 1600 interfering substances. A more specific alternate chemical method 1700 must be used in order to obtain a confirmed analytical result.  1800 Gas chromato graphy / mass spectrometry (GC/MS) is the preferred 1900 confirmatory method.     Current  Facility-Administered Medications  Medication Dose Route Frequency Provider Last Rate Last Dose  . cephALEXin (KEFLEX) capsule 500 mg  500 mg Oral Q6H Delman Kitten, MD   500 mg at 06/12/16 0700  . FLUoxetine (PROZAC) capsule 20 mg  20 mg Oral Daily Delman Kitten, MD   20 mg at 06/12/16 0943  . hydrOXYzine (ATARAX/VISTARIL) tablet 50 mg  50 mg Oral TID PRN Delman Kitten, MD   50 mg at 06/12/16 0951  . LORazepam (ATIVAN) tablet 1 mg  1 mg Oral Q8H PRN Delman Kitten, MD   1 mg at 06/10/16 1810  . naproxen (NAPROSYN) tablet 500 mg  500 mg Oral TID WC Delman Kitten, MD   500 mg at 06/11/16 1755  . nicotine (NICODERM CQ - dosed in mg/24 hours) patch 21 mg  21 mg Transdermal Daily Rudene Re, MD   21 mg at 06/12/16 0950  . pantoprazole (PROTONIX) EC tablet 40 mg  40 mg Oral BID AC Delman Kitten, MD   40 mg at 06/11/16 1744  . traZODone (DESYREL) tablet 150 mg  150 mg Oral QHS Delman Kitten, MD   150 mg at 06/11/16 2142   Current Outpatient Prescriptions  Medication Sig Dispense Refill  . cephALEXin (KEFLEX) 500 MG capsule Take 1 capsule (500 mg total) by mouth 4 (four) times daily. 21 capsule 0  . FLUoxetine (PROZAC) 20 MG capsule Take 1 capsule (20 mg total) by  mouth daily. 30 capsule 1  . hydrOXYzine (VISTARIL) 50 MG capsule Take 50 mg by mouth 3 (three) times daily as needed for anxiety.    . naproxen (NAPROSYN) 500 MG tablet Take 500 mg by mouth 3 (three) times daily with meals.    . pantoprazole (PROTONIX) 40 MG tablet Take 1 tablet (40 mg total) by mouth 2 (two) times daily before a meal. 60 tablet 1  . traZODone (DESYREL) 100 MG tablet Take 150 mg by mouth at bedtime.    . traZODone (DESYREL) 150 MG tablet Take 1 tablet (150 mg total) by mouth at bedtime. 30 tablet 1    Musculoskeletal: Strength & Muscle Tone: within normal limits Gait & Station: normal Patient leans: N/A  Psychiatric Specialty Exam: Physical Exam  Nursing note and vitals reviewed. Constitutional: He appears well-developed and  well-nourished.  HENT:  Head: Normocephalic and atraumatic.  Eyes: Conjunctivae are normal. Pupils are equal, round, and reactive to light.  Neck: Normal range of motion.  Cardiovascular: Regular rhythm and normal heart sounds.   Respiratory: Effort normal. No respiratory distress.  GI: Soft.  Musculoskeletal: Normal range of motion.  Neurological: He is alert.  Skin: Skin is warm and dry.  Psychiatric: His speech is normal and behavior is normal. Judgment and thought content normal. His mood appears anxious. He expresses no suicidal ideation.    Review of Systems  Constitutional: Negative.   HENT: Negative.   Eyes: Negative.   Respiratory: Negative.   Cardiovascular: Negative.   Gastrointestinal: Negative.   Musculoskeletal: Negative.   Skin: Negative.   Neurological: Negative.   Psychiatric/Behavioral: Positive for depression and substance abuse. Negative for hallucinations, memory loss and suicidal ideas. The patient is nervous/anxious and has insomnia.     Blood pressure (!) 89/66, pulse 69, temperature 98 F (36.7 C), temperature source Oral, resp. rate 18, height _0  (1.753 m), weight 65.8 kg (145 lb), SpO2 98 %.Body mass index is 21.41 kg/m.  General Appearance: Casual  Eye Contact:  Good  Speech:  Clear and Coherent  Volume:  Normal  Mood:  Euthymic  Affect:  Appropriate  Thought Process:  Goal Directed  Orientation:  Full (Time, Place, and Person)  Thought Content:  Logical  Suicidal Thoughts:  No  Homicidal Thoughts:  No  Memory:  Immediate;   Good Recent;   Good Remote;   Good  Judgement:  Fair  Insight:  Fair  Psychomotor Activity:  Normal  Concentration:  Concentration: Fair  Recall:  [place] of Knowledge:  Fair  Language:  Fair  Akathisia:  No  Handed:  Right  AIMS (if indicated):     Assets:  Communication Skills Desire for Improvement Physical Health Resilience  ADL's:  Intact  Cognition:  WNL  Sleep:        Treatment Plan  Summary: Plan 35 year old man now returned to baseline. We didn't have an alcohol level on presentation here but sounds like he may have been intoxicated. Clearly now sober. Lucid. No suicidal ideation. Good positive plan for the future. Patient does not require inpatient commitment. Discontinue IVC paperwork. He can be discharged and will follow-up at [facility] and continue on his current medicine of Prozac hydroxyzine and trazodone.  Disposition: Patient does not meet criteria for psychiatric inpatient admission. Supportive therapy provided about ongoing stressors.  Alethia Berthold, MD 06/12/2016 4:14 PM

## 2016-06-12 NOTE — ED Notes (Signed)
pts familyfriends called and they are unable to pick him up or find him a place to stay he says he will have to walk to a shelter , he has no c/o and he is in no distress

## 2016-06-19 ENCOUNTER — Ambulatory Visit: Payer: Self-pay

## 2016-08-07 ENCOUNTER — Ambulatory Visit: Payer: No Typology Code available for payment source | Admitting: Pharmacy Technician

## 2016-08-07 DIAGNOSIS — Z79899 Other long term (current) drug therapy: Secondary | ICD-10-CM

## 2016-08-07 NOTE — Progress Notes (Signed)
Completed [facility] application and contract.  Patient agreed to all terms of the [facility] contract.  Patient to provide letter from [facility] verifying that he lives there, last 30 days of pay stubs, and the last 30 days of activity for his checking account statement.  Called [facility].  Patient to go to [facility] at 8:00pm on Friday, August 10, 2016.  Also, provided patient with other community resource material based on his particular needs.    Sherilyn DacostaBetty J. Briant Angelillo Care Manager [facility]

## 2016-08-20 ENCOUNTER — Telehealth: Payer: Self-pay | Admitting: Pharmacist

## 2016-08-20 NOTE — Telephone Encounter (Signed)
Pt did not call [facility] back on 08-03-16. We were going to fill a 5 day supply of his fluoxetine. Pt called Monday 08-06-16 and [facility] filled a one-day supply of his fluoxetine to bridge until his eligibility appointment on 08-07-16. Patient did show for his eligibility appointment on 08-07-16. [facility] filled a 30 day supply of all medications on 08-07-16.  Jaquez Farrington K. Joelene MillinHarrison, BS, PharmD [facility]-Pharmacy Operations Coordinator [phone]

## 2016-08-20 NOTE — Telephone Encounter (Signed)
-----   Message from Trena PlattVonda F Henderson sent at 08/15/2016 10:58 AM EDT ----- Regarding: Refill On 08/03/2016 Patient request refill pf prozac. Per Lorina RabonKeri will fill patient an emergency 5 day supply until he comes in for appointment on 08/07/2016. Called patient back to let him know. Could not contact. Left message for patient to call me.

## 2016-10-23 ENCOUNTER — Telehealth: Payer: Self-pay | Admitting: Pharmacy Technician

## 2016-10-23 NOTE — Telephone Encounter (Signed)
Patient failed to provide proof of income.  [facility] no longer living at [facility].   No additional medication assistance will be provided by [facility] without the required proof of income documentation.  Patient notified by letter.  Sherilyn DacostaBetty J. Autumnrose Yore Care Manager [facility]

## 2016-11-15 ENCOUNTER — Emergency Department: Admission: EM | Admit: 2016-11-15 | Discharge: 2016-11-15 | Discharge status: Absent without leave | Payer: Self-pay

## 2017-03-24 ENCOUNTER — Emergency Department: Admission: EM | Admit: 2017-03-24 | Discharge: 2017-03-24 | Discharge status: Against Medical Advice | Payer: Self-pay

## 2017-03-24 NOTE — ED Notes (Signed)
Patient stating "I'm going to step out side for a second."

## 2017-03-24 NOTE — ED Notes (Signed)
Outside to look for patient, unable to locate.
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
## 2017-03-25 ENCOUNTER — Telehealth: Payer: Self-pay | Admitting: Emergency Medicine

## 2017-03-25 NOTE — Telephone Encounter (Signed)
Called patient due to lwot to inquire about condition and follow up plans. Person who answered says I have the wrong number.

## 2017-06-18 ENCOUNTER — Emergency Department
Admission: EM | Admit: 2017-06-18 | Discharge: 2017-06-18 | Disposition: A | Discharge status: Home / Self Care | Payer: Self-pay | Attending: Emergency Medicine | Admitting: Emergency Medicine

## 2017-06-18 ENCOUNTER — Encounter: Payer: Self-pay | Admitting: Emergency Medicine

## 2017-06-18 DIAGNOSIS — K0263 Dental caries on smooth surface penetrating into pulp: Secondary | ICD-10-CM | POA: Insufficient documentation

## 2017-06-18 DIAGNOSIS — Z79899 Other long term (current) drug therapy: Secondary | ICD-10-CM | POA: Insufficient documentation

## 2017-06-18 DIAGNOSIS — K029 Dental caries, unspecified: Secondary | ICD-10-CM

## 2017-06-18 DIAGNOSIS — F17210 Nicotine dependence, cigarettes, uncomplicated: Secondary | ICD-10-CM | POA: Insufficient documentation

## 2017-06-18 DIAGNOSIS — K047 Periapical abscess without sinus: Secondary | ICD-10-CM | POA: Insufficient documentation

## 2017-06-18 MED ORDER — PENICILLIN V POTASSIUM 500 MG PO TABS: 500.0000 mg | ORAL_TABLET | Freq: Four times a day (QID) | ORAL | 0 refills | Status: AC

## 2017-06-18 MED ORDER — TRAMADOL HCL 50 MG PO TABS
50.0000 mg | ORAL_TABLET | Freq: Once | ORAL | Status: AC
  Administered 2017-06-18: 50 mg via ORAL
  Filled 2017-06-18 (×2): qty 1

## 2017-06-18 MED ORDER — PENICILLIN V POTASSIUM 500 MG PO TABS
500.0000 mg | ORAL_TABLET | Freq: Once | ORAL | Status: AC
  Administered 2017-06-18: 500 mg via ORAL
  Filled 2017-06-18: qty 1

## 2017-06-18 MED ORDER — TRAMADOL HCL 50 MG PO TABS: 50.0000 mg | ORAL_TABLET | Freq: Two times a day (BID) | ORAL | 0 refills | Status: AC

## 2017-06-18 NOTE — Discharge Instructions (Signed)
Take the prescription meds as directed. Rinse after every meal with warm salty-water. Follow-up with [facility] or [facility] for dental extraction. Return to the ED as needed.

## 2017-06-18 NOTE — ED Notes (Addendum)
See triage note  States he has a brokeen tooth on left lower  Having intermittent pain  States yesterday the pain became worse  Swelling noted to left side of face this am

## 2017-06-18 NOTE — ED Triage Notes (Signed)
Pt c/o pain/swelling to left jaw for 2 days. Has broken tooth.  Handling secretions. Unlabored respirations.

## 2017-06-18 NOTE — ED Provider Notes (Signed)
[facility] Emergency Department Provider Note ____________________________________________  Time seen: 1457  I have reviewed the triage vital signs and the nursing notes.  HISTORY  Chief Complaint  Dental Problem  HPI Christopher Murray is a 36 y.o. male presents to the ED for evaluation of left lower jaw swelling. He notes 2 days of increasing lower jaw pain and swelling. He has poor dentition and notes multiple chronically broken molars. He denies any fevers, chills, sweats, or spontaneous drainage. He recalls having a "sweet" taste in his mouth.   Past Medical History:  Diagnosis Date  . Depression   . Scoliosis     Patient Active Problem List   Diagnosis Date Noted  . Alcohol use disorder, severe, dependence (HCC) 05/23/2016  . Cocaine use disorder, severe, dependence (HCC) 05/23/2016  . Tobacco use disorder 05/23/2016  . Self-inflicted injury 05/23/2016  . Major depressive disorder, recurrent severe without psychotic features (HCC) 05/23/2016  . Suicidal behavior 05/23/2016  . Sedative, hypnotic or anxiolytic abuse, episodic 05/23/2016    History reviewed. No pertinent surgical history.  Prior to Admission medications   Medication Sig Start Date End Date Taking? Authorizing Provider  busPIRone (BUSPAR) 30 MG tablet Take 30 mg by mouth 2 (two) times daily.   Yes [provider]  mirtazapine (REMERON) 30 MG tablet Take 30 mg by mouth at bedtime.   Yes [provider]  cephALEXin (KEFLEX) 500 MG capsule Take 1 capsule (500 mg total) by mouth 4 (four) times daily. 06/04/16   Tommi Rumps, PA-C  FLUoxetine (PROZAC) 20 MG capsule Take 1 capsule (20 mg total) by mouth daily. Patient taking differently: Take 40 mg by mouth daily.  05/28/16   Pucilowska, Jolanta B, MD  hydrOXYzine (VISTARIL) 50 MG capsule Take 50 mg by mouth 3 (three) times daily as needed for anxiety.    [provider]  naproxen (NAPROSYN) 500 MG tablet Take  500 mg by mouth 3 (three) times daily with meals.    [provider]  pantoprazole (PROTONIX) 40 MG tablet Take 1 tablet (40 mg total) by mouth 2 (two) times daily before a meal. 05/28/16   Pucilowska, Jolanta B, MD  penicillin v potassium (VEETID) 500 MG tablet Take 1 tablet (500 mg total) by mouth 4 (four) times daily. 06/18/17   Taquisha Phung, Charlesetta Ivory, PA-C  traMADol (ULTRAM) 50 MG tablet Take 1 tablet (50 mg total) by mouth 2 (two) times daily. 06/18/17   Rembert Browe, Charlesetta Ivory, PA-C  traZODone (DESYREL) 100 MG tablet Take 150 mg by mouth at bedtime.    [provider]  traZODone (DESYREL) 150 MG tablet Take 1 tablet (150 mg total) by mouth at bedtime. 05/28/16   Pucilowska, Ellin Goodie, MD    Allergies Wellbutrin [bupropion hcl] and Banana  History reviewed. No pertinent family history.  Social History Social History  Substance Use Topics  . Smoking status: Current Every Day Smoker    Packs/day: 1.00    Types: Cigarettes  . Smokeless tobacco: Never Used  . Alcohol use 3.6 oz/week    6 Cans of beer per week    Review of Systems  Constitutional: Negative for fever. Eyes: Negative for visual changes. ENT: Negative for sore throat. Dental infection as above. Cardiovascular: Negative for chest pain. Respiratory: Negative for shortness of breath. Gastrointestinal: Negative for abdominal pain, vomiting and diarrhea. Neurological: Negative for headaches, focal weakness or numbness. ____________________________________________  PHYSICAL EXAM:  VITAL SIGNS: ED Triage Vitals  Enc Vitals  Group     BP 06/18/17 1344 (!) 141/80     Pulse Rate 06/18/17 1344 (!) 108     Resp 06/18/17 1344 18     Temp 06/18/17 1343 98.1 F (36.7 C)     Temp Source 06/18/17 1343 Oral     SpO2 06/18/17 1344 96 %     Weight 06/18/17 1343 170 lb (77.1 kg)     Height 06/18/17 1343 5\' 9"  (1.753 m)     Head Circumference --      Peak Flow --      Pain Score 06/18/17 1342 10     Pain Loc --       Pain Edu? --      Excl. in GC? --     Constitutional: Alert and oriented. Well appearing and in no distress. Head: Normocephalic and atraumatic. Eyes: Conjunctivae are normal. PERRL. Normal extraocular movements Nose: No congestion/rhinorrhea/epistaxis. Mouth/Throat: Global dental erosion and caries. Left lower molars are eroded to the gumline. Mucous membranes are moist. Uvula is midline and tonsils are flat. Left lower jaw with focal edema without focal buccal fluctuance. No sublingual, brawny edema noted.  Neck: Supple. No thyromegaly. Hematological/Lymphatic/Immunological: No cervical lymphadenopathy. Cardiovascular: Normal rate, regular rhythm. Normal distal pulses. Respiratory: Normal respiratory effort. No wheezes/rales/rhonchi. Skin:  Skin is warm, dry and intact. No rash noted. ____________________________________________  PROCEDURES  Pen VK 500 mg PO Ultram 50 mg PO ____________________________________________  INITIAL IMPRESSION / ASSESSMENT AND PLAN / ED COURSE  Patient with acute dental infection with focal jaw swelling. No drainable abscess on presentation. He will be discharged with Pen VK and Ultram. He will follow-up with [facility] clinic or [facility]. Return to the ED for acutely worsening symptoms, as discussed.  ____________________________________________  FINAL CLINICAL IMPRESSION(S) / ED DIAGNOSES  Final diagnoses:  Dental abscess  Dental caries extending into pulp      Anthonymichael Munday, Charlesetta Ivory, PA-C 06/18/17 1555    Rockne Menghini, MD 06/18/17 1616

## 2018-04-15 ENCOUNTER — Other Ambulatory Visit: Payer: Self-pay

## 2018-04-15 ENCOUNTER — Emergency Department
Admission: EM | Admit: 2018-04-15 | Discharge: 2018-04-15 | Disposition: A | Discharge status: Home / Self Care | Payer: Self-pay | Attending: Emergency Medicine | Admitting: Emergency Medicine

## 2018-04-15 ENCOUNTER — Encounter: Payer: Self-pay | Admitting: Emergency Medicine

## 2018-04-15 DIAGNOSIS — F10120 Alcohol abuse with intoxication, uncomplicated: Secondary | ICD-10-CM | POA: Insufficient documentation

## 2018-04-15 DIAGNOSIS — Z79899 Other long term (current) drug therapy: Secondary | ICD-10-CM | POA: Insufficient documentation

## 2018-04-15 DIAGNOSIS — F17210 Nicotine dependence, cigarettes, uncomplicated: Secondary | ICD-10-CM | POA: Insufficient documentation

## 2018-04-15 DIAGNOSIS — F10920 Alcohol use, unspecified with intoxication, uncomplicated: Secondary | ICD-10-CM

## 2018-04-15 NOTE — ED Notes (Signed)
Pt given PO fluids. Tolerated well. 

## 2018-04-15 NOTE — ED Provider Notes (Signed)
[facility] Emergency Department Provider Note   ____________________________________________   First MD Initiated Contact with Patient 04/15/18 [phone]     (approximate)  I have reviewed the triage vital signs and the nursing notes.   HISTORY  Chief Complaint Alcohol Intoxication    HPI Christopher Murray is a 37 y.o. male who comes into the hospital today with some alcohol intoxication.  The patient was brought by EMS.  He states that he is tired and more out.  According to EMS the patient was publicly intoxicated.  He was passed out at a gas station and slurring his speech and stumbling around.  The patient initially said that he was not drinking anything it had just been a long day.  He reports that he is not supposed to drink taking his medications.  He reports that he is just tired but after speaking with him and telling him that I just wanted to make sure that he was okay he states that he did have some beer earlier.  He denies any pain anywhere any other complaints.  He is here for evaluation.   Past Medical History:  Diagnosis Date  . Depression   . Scoliosis     Patient Active Problem List   Diagnosis Date Noted  . Alcohol use disorder, severe, dependence (HCC) 05/23/2016  . Cocaine use disorder, severe, dependence (HCC) 05/23/2016  . Tobacco use disorder 05/23/2016  . Self-inflicted injury 05/23/2016  . Major depressive disorder, recurrent severe without psychotic features (HCC) 05/23/2016  . Suicidal behavior 05/23/2016  . Sedative, hypnotic or anxiolytic abuse, episodic (HCC) 05/23/2016    Past Surgical History:  Procedure Laterality Date  . HERNIA REPAIR      Prior to Admission medications   Medication Sig Start Date End Date Taking? Authorizing Provider  busPIRone (BUSPAR) 30 MG tablet Take 30 mg by mouth 2 (two) times daily.    [provider]  cephALEXin (KEFLEX) 500 MG capsule Take 1 capsule (500 mg total) by mouth 4  (four) times daily. Patient not taking: Reported on 04/15/2018 06/04/16   Tommi Rumps, PA-C  FLUoxetine (PROZAC) 20 MG capsule Take 1 capsule (20 mg total) by mouth daily. Patient not taking: Reported on 04/15/2018 05/28/16   Pucilowska, Ellin Goodie, MD  hydrOXYzine (VISTARIL) 50 MG capsule Take 50 mg by mouth 3 (three) times daily as needed for anxiety.    [provider]  mirtazapine (REMERON) 30 MG tablet Take 30 mg by mouth at bedtime.    [provider]  naproxen (NAPROSYN) 500 MG tablet Take 500 mg by mouth 3 (three) times daily with meals.    [provider]  pantoprazole (PROTONIX) 40 MG tablet Take 1 tablet (40 mg total) by mouth 2 (two) times daily before a meal. Patient not taking: Reported on 04/15/2018 05/28/16   Pucilowska, Braulio Conte B, MD  penicillin v potassium (VEETID) 500 MG tablet Take 1 tablet (500 mg total) by mouth 4 (four) times daily. Patient not taking: Reported on 04/15/2018 06/18/17   Menshew, Charlesetta Ivory, PA-C  traMADol (ULTRAM) 50 MG tablet Take 1 tablet (50 mg total) by mouth 2 (two) times daily. Patient not taking: Reported on 04/15/2018 06/18/17   Menshew, Charlesetta Ivory, PA-C  traZODone (DESYREL) 100 MG tablet Take 150 mg by mouth at bedtime.    [provider]  traZODone (DESYREL) 150 MG tablet Take 1 tablet (150 mg total) by mouth at bedtime. Patient not taking: Reported on 04/15/2018  05/28/16   Pucilowska, Ellin GoodieJolanta B, MD    Allergies Wellbutrin [bupropion hcl] and Banana  History reviewed. No pertinent family history.  Social History Social History   Tobacco Use  . Smoking status: Current Every Day Smoker    Packs/day: 1.00    Types: Cigarettes  . Smokeless tobacco: Never Used  Substance Use Topics  . Alcohol use: Yes    Alcohol/week: 3.6 oz    Types: 6 Cans of beer per week  . Drug use: Yes    Frequency: 1.0 times per week    Types: Cocaine, Marijuana    Review of Systems  Constitutional: No fever/chills Eyes: No  visual changes. ENT: No sore throat. Cardiovascular: Denies chest pain. Respiratory: Denies shortness of breath. Gastrointestinal: No abdominal pain.  No nausea, no vomiting.   Genitourinary: Negative for dysuria. Musculoskeletal: Negative for back pain. Skin: Negative for rash. Neurological: Negative for headaches, focal weakness or numbness.   ____________________________________________   PHYSICAL EXAM:  VITAL SIGNS: ED Triage Vitals  Enc Vitals Group     BP 04/15/18 0332 (!) 117/103     Pulse Rate 04/15/18 0332 90     Resp 04/15/18 0332 18     Temp 04/15/18 0332 97.8 F (36.6 C)     Temp Source 04/15/18 0332 Oral     SpO2 04/15/18 0332 97 %     Weight 04/15/18 0333 170 lb (77.1 kg)     Height 04/15/18 0333 5\' 9"  (1.753 m)     Head Circumference --      Peak Flow --      Pain Score 04/15/18 0333 0     Pain Loc --      Pain Edu? --      Excl. in GC? --     Constitutional: Alert and oriented. Well appearing and in mild distress.  Patient with some mildly slurred speech and some stumbling. Eyes: Conjunctivae are normal. PERRL. EOMI. Head: Atraumatic. Nose: No congestion/rhinnorhea. Mouth/Throat: Mucous membranes are moist.  Oropharynx non-erythematous. Cardiovascular: Normal rate, regular rhythm. Grossly normal heart sounds.  Good peripheral circulation. Respiratory: Normal respiratory effort.  No retractions. Lungs CTAB. Gastrointestinal: Soft and nontender. No distention.  Positive bowel sounds Musculoskeletal: No lower extremity tenderness nor edema.   Neurologic: Patient has some mildly slurred speech and is stumbling but cranial nerves otherwise intact. Skin:  Skin is warm, dry and intact.  Psychiatric: Mood and affect are normal.   ____________________________________________   LABS (all labs ordered are listed, but only abnormal results are displayed)  Labs Reviewed - No data to  display ____________________________________________  EKG  none ____________________________________________  RADIOLOGY  ED MD interpretation:  none  Official radiology report(s): No results found.  ____________________________________________   PROCEDURES  Procedure(s) performed: None  Procedures  Critical Care performed: No  ____________________________________________   INITIAL IMPRESSION / ASSESSMENT AND PLAN / ED COURSE  As part of my medical decision making, I reviewed the following data within the electronic MEDICAL RECORD NUMBER Notes from prior ED visits and [place] Controlled Substance Database   This is a 37 year old male who comes into the hospital today with some public intoxication.  He was unsteady on his feet and had some slurred speech.  As time went on we did continue monitoring the patient.  He started becoming agitated and was asking when he could leave.  The patient did walk outside at one point to smoke but then came back.  After a few hours the patient was clinically sober.  He reports that he is homeless and he has been sleeping in his car and trying to work.  The patient continues to have no complaints and he wants to go.  He does not have anyone that can pick him up.  The patient will be discharged to home.      ____________________________________________   FINAL CLINICAL IMPRESSION(S) / ED DIAGNOSES  Final diagnoses:  Alcoholic intoxication without complication [facility])     ED Discharge Orders    None       Note:  This document was prepared using Dragon voice recognition software and may include unintentional dictation errors.    Rebecka Apley, MD 04/15/18 [phone]

## 2018-04-15 NOTE — Discharge Instructions (Addendum)
Please refrain from drinking and taking your psych medications. Please follow up with the acute care clinic.

## 2018-04-15 NOTE — ED Triage Notes (Addendum)
Pt presents to ED with c/o public intoxication. EMS was called after pt was found by concerned bystanders "passed out" at a local gas station. Pt arrived to ED by EMS awake and answering questions. Pt states he gets anxious easily. Pt denies pain or injury.

## 2022-04-25 ENCOUNTER — Emergency Department
Admission: EM | Admit: 2022-04-25 | Discharge: 2022-04-25 | Discharge status: Against Medical Advice | Payer: Self-pay | Attending: Emergency Medicine | Admitting: Emergency Medicine

## 2022-04-25 DIAGNOSIS — F1410 Cocaine abuse, uncomplicated: Secondary | ICD-10-CM | POA: Insufficient documentation

## 2022-04-25 DIAGNOSIS — Z5321 Procedure and treatment not carried out due to patient leaving prior to being seen by health care provider: Secondary | ICD-10-CM | POA: Insufficient documentation

## 2022-04-25 DIAGNOSIS — R55 Syncope and collapse: Secondary | ICD-10-CM | POA: Insufficient documentation

## 2022-04-25 DIAGNOSIS — F10130 Alcohol abuse with withdrawal, uncomplicated: Secondary | ICD-10-CM | POA: Insufficient documentation

## 2022-04-25 DIAGNOSIS — Y902 Blood alcohol level of 40-59 mg/100 ml: Secondary | ICD-10-CM | POA: Insufficient documentation

## 2022-04-25 LAB — COMPREHENSIVE METABOLIC PANEL
ALT: 13 U/L (ref 0–44)
AST: 18 U/L (ref 15–41)
Albumin: 4.1 g/dL (ref 3.5–5.0)
Alkaline Phosphatase: 64 U/L (ref 38–126)
Anion gap: 10 (ref 5–15)
BUN: 11 mg/dL (ref 6–20)
CO2: 27 mmol/L (ref 22–32)
Calcium: 9.2 mg/dL (ref 8.9–10.3)
Chloride: 101 mmol/L (ref 98–111)
Creatinine, Ser: 0.77 mg/dL (ref 0.61–1.24)
GFR, Estimated: >60 mL/min (ref >60)
Glucose, Bld: 107 mg/dL — ABNORMAL HIGH (ref 70–99)
Potassium: 3.7 mmol/L (ref 3.5–5.1)
Sodium: 138 mmol/L (ref 135–145)
Total Bilirubin: 0.4 mg/dL (ref 0.3–1.2)
Total Protein: 7.8 g/dL (ref 6.5–8.1)

## 2022-04-25 LAB — URINE DRUG SCREEN, QUALITATIVE (ARMC ONLY)
Amphetamines, Ur Screen: NOT DETECTED
Barbiturates, Ur Screen: NOT DETECTED
Benzodiazepine, Ur Scrn: NOT DETECTED
Cannabinoid 50 Ng, Ur ~~LOC~~: NOT DETECTED
Cocaine Metabolite,Ur ~~LOC~~: POSITIVE — AB
MDMA (Ecstasy)Ur Screen: NOT DETECTED
Methadone Scn, Ur: NOT DETECTED
Opiate, Ur Screen: NOT DETECTED
Phencyclidine (PCP) Ur S: NOT DETECTED
Tricyclic, Ur Screen: NOT DETECTED

## 2022-04-25 LAB — CBC
HCT: 41.7 % (ref 39.0–52.0)
Hemoglobin: 14.2 g/dL (ref 13.0–17.0)
MCH: 34.5 pg — ABNORMAL HIGH (ref 26.0–34.0)
MCHC: 34.1 g/dL (ref 30.0–36.0)
MCV: 101.5 fL — ABNORMAL HIGH (ref 80.0–100.0)
Platelets: 342 10*3/uL (ref 150–400)
RBC: 4.11 MIL/uL — ABNORMAL LOW (ref 4.22–5.81)
RDW: 13.2 % (ref 11.5–15.5)
WBC: 10.2 10*3/uL (ref 4.0–10.5)
nRBC: 0 % (ref 0.0–0.2)

## 2022-04-25 LAB — ETHANOL: Alcohol, Ethyl (B): 47 mg/dL — ABNORMAL HIGH (ref <10)

## 2022-04-25 NOTE — ED Triage Notes (Signed)
Pt c/o wanting help with alcohol and drug use, states he has been having auditory and visual hallucinations, denies SI/HI

## 2022-04-25 NOTE — ED Notes (Signed)
Patient got up from 24 hall chair and walked out of department while this RN was getting another patient's belongings from locker. Patient was voluntary and not dressed out. This RN had not assessed patient prior to him leaving.

## 2022-04-25 NOTE — ED Provider Notes (Signed)
Patient got up and left while I was saying a 41 year old with syncope.  He is not here.  He was not homicidal or suicidal.  He wanted help with alcohol detox.  I will not send the police after him at this point.  If we can find him and see him we will try to do so.   Arnaldo Natal, MD 04/25/22 [phone]
